# Patient Record
Sex: Male | Born: 1975 | Race: White | Hispanic: No | Marital: Single | Smoking: Former smoker
Health system: Southern US, Community
[De-identification: ages and names within clinical notes are randomized; demographics above are authoritative.]

## PROBLEM LIST (undated history)

## (undated) DIAGNOSIS — F909 Attention-deficit hyperactivity disorder, unspecified type: Secondary | ICD-10-CM

## (undated) DIAGNOSIS — B019 Varicella without complication: Secondary | ICD-10-CM

## (undated) HISTORY — DX: Attention-deficit hyperactivity disorder, unspecified type: F90.9

## (undated) HISTORY — DX: Varicella without complication: B01.9

---

## 1999-07-15 ENCOUNTER — Emergency Department (HOSPITAL_COMMUNITY): Admission: EM | Admit: 1999-07-15 | Discharge: 1999-07-15 | Payer: Self-pay | Admitting: Family Medicine

## 1999-07-15 ENCOUNTER — Encounter: Payer: Self-pay | Admitting: Emergency Medicine

## 1999-07-17 ENCOUNTER — Emergency Department (HOSPITAL_COMMUNITY): Admission: EM | Admit: 1999-07-17 | Discharge: 1999-07-17 | Payer: Self-pay | Admitting: Emergency Medicine

## 2000-06-27 ENCOUNTER — Emergency Department (HOSPITAL_COMMUNITY): Admission: EM | Admit: 2000-06-27 | Discharge: 2000-06-27 | Payer: Self-pay | Admitting: Emergency Medicine

## 2001-12-11 ENCOUNTER — Emergency Department (HOSPITAL_COMMUNITY): Admission: EM | Admit: 2001-12-11 | Discharge: 2001-12-11 | Payer: Self-pay | Admitting: Emergency Medicine

## 2001-12-13 ENCOUNTER — Emergency Department (HOSPITAL_COMMUNITY): Admission: EM | Admit: 2001-12-13 | Discharge: 2001-12-13 | Payer: Self-pay | Admitting: Emergency Medicine

## 2003-06-28 HISTORY — PX: ANKLE FRACTURE SURGERY: SHX122

## 2004-02-06 ENCOUNTER — Emergency Department (HOSPITAL_COMMUNITY): Admission: EM | Admit: 2004-02-06 | Discharge: 2004-02-06 | Payer: Self-pay | Admitting: Emergency Medicine

## 2004-02-12 ENCOUNTER — Ambulatory Visit (HOSPITAL_COMMUNITY): Admission: RE | Admit: 2004-02-12 | Discharge: 2004-02-12 | Payer: Self-pay | Admitting: Orthopedic Surgery

## 2005-08-01 IMAGING — CR DG ANKLE COMPLETE 3+V*L*
4 series · 4 of 4 positions shown · non-contrast
Comparison: none

CLINICAL DATA: Left ankle pain.  Fell jumping over wall.
 LEFT ANKLE
 A trimalleolar fracture is present.  There is a disruption of the ankle mortis.  The lateral malleolar component consists mainly of a distal fibular fracture, which is comminuted along with an avulsion off the inferior aspect of the lateral malleolus.  The medial malleolar fracture is a transverse fracture just above the ankle mortis with slight widening medially.  Posterior malleolar fracture is seen on the lateral view.  Marked soft tissue swelling is present.
 IMPRESSION 
 Trimalleolar fracture with widening of the ankle mortis.

[view not recorded (1 of 4)]
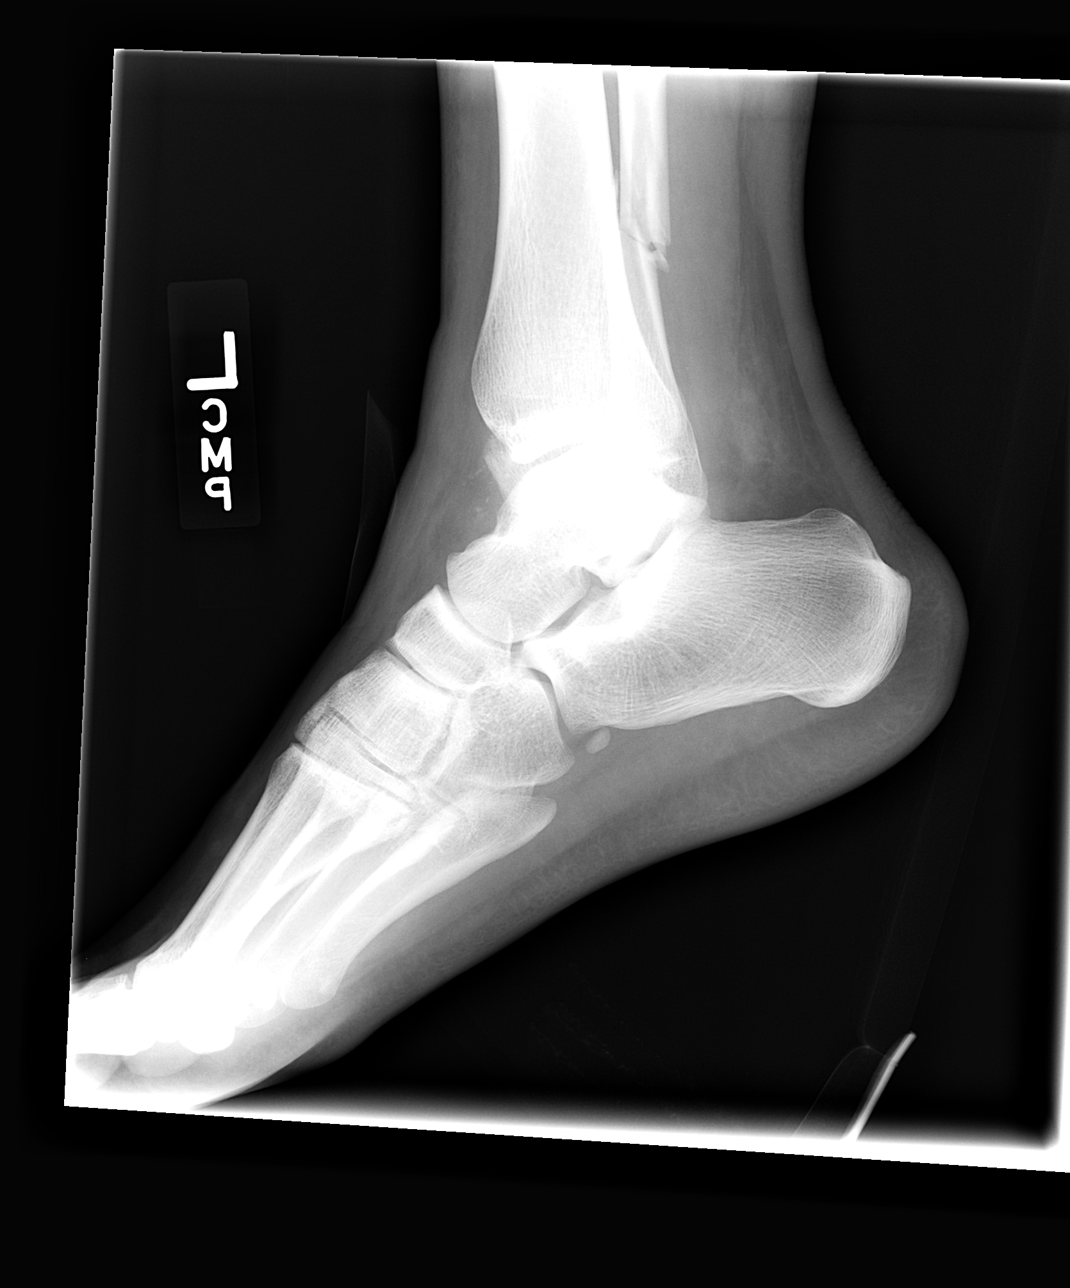

[view not recorded (2 of 4)]
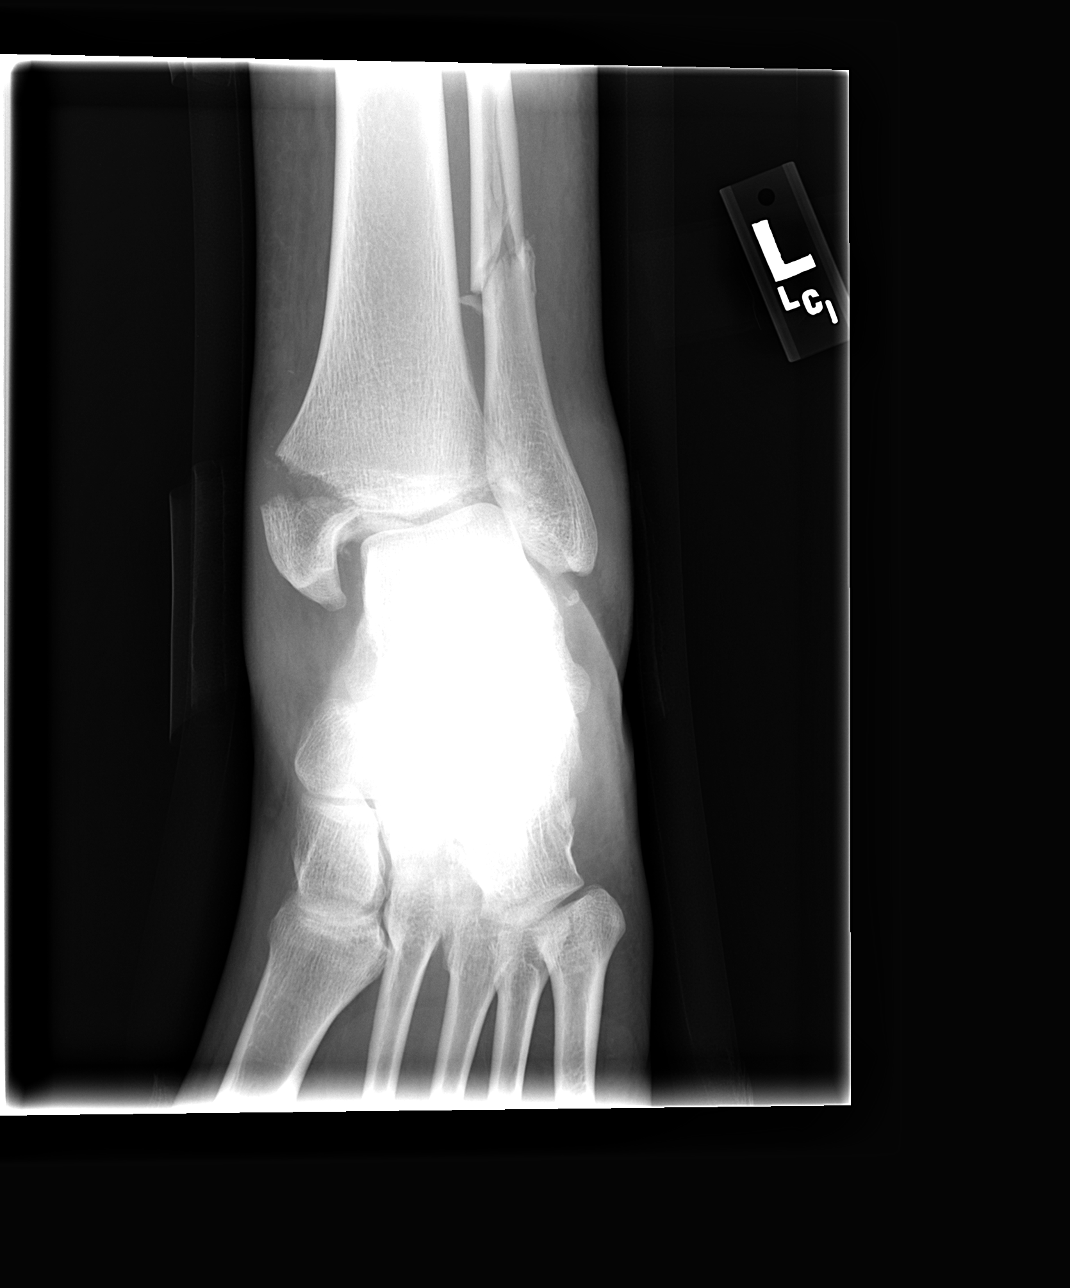

[view not recorded (3 of 4)]
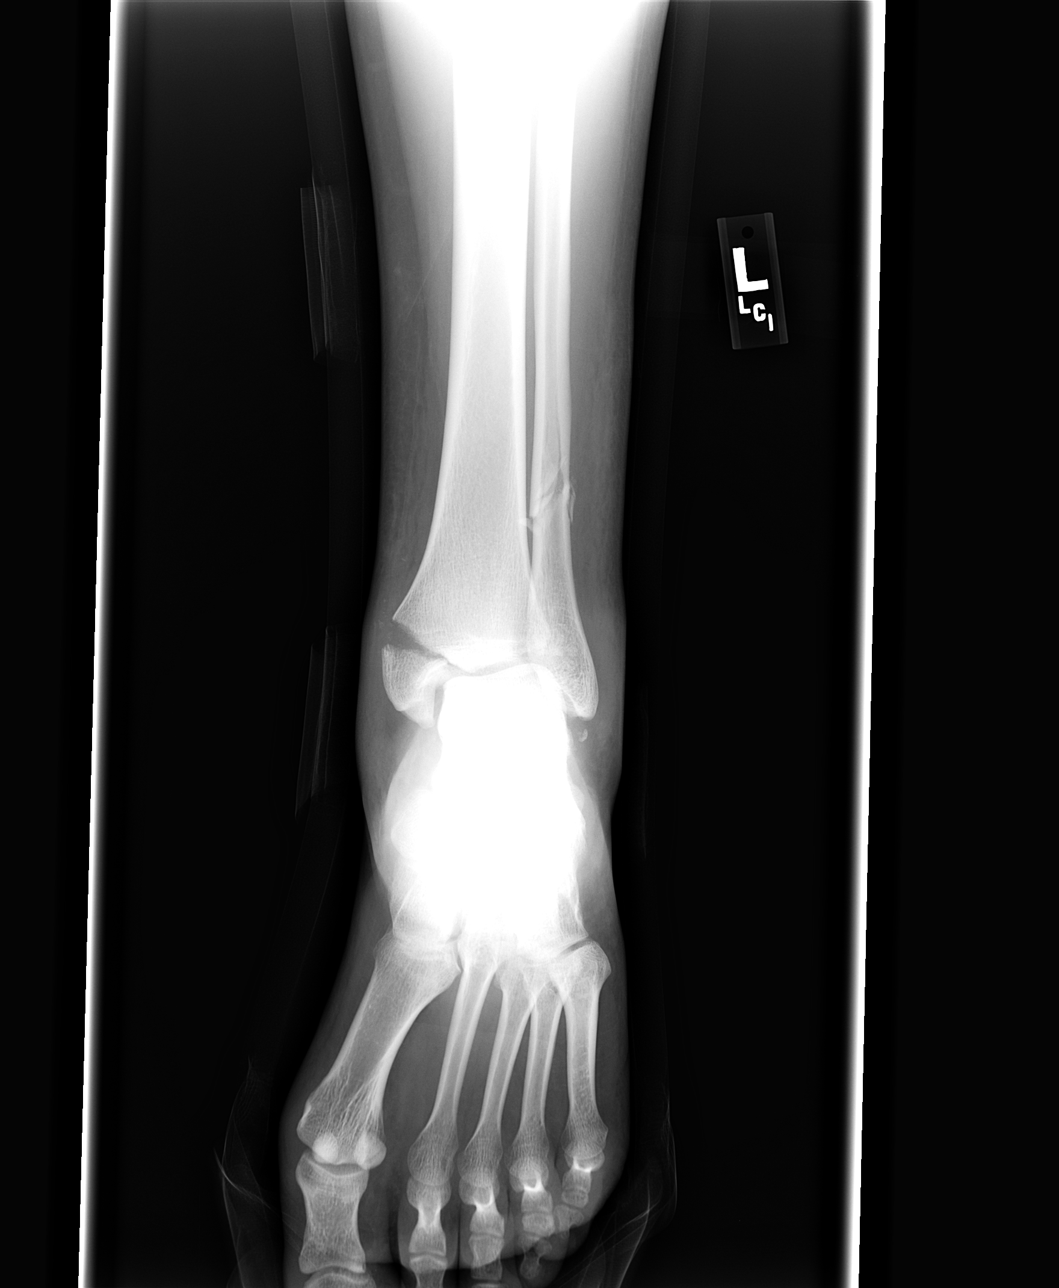

[view not recorded (4 of 4)]
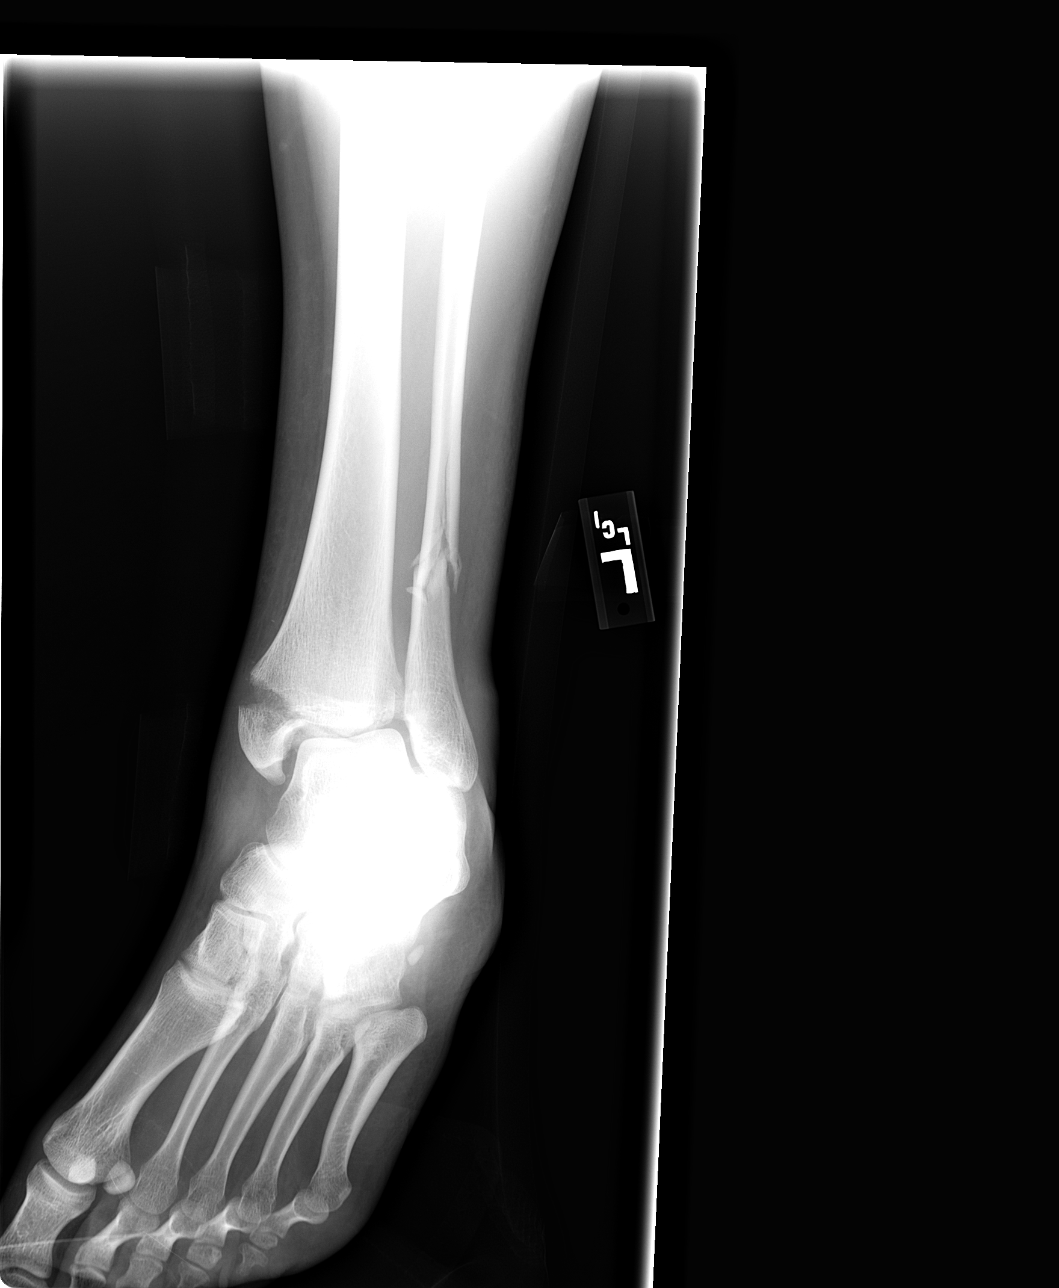

[4 of 4 positions shown; findings below may reference images not displayed]

## 2007-12-10 ENCOUNTER — Emergency Department (HOSPITAL_COMMUNITY): Admission: EM | Admit: 2007-12-10 | Discharge: 2007-12-10 | Payer: Self-pay | Admitting: Emergency Medicine

## 2008-05-01 ENCOUNTER — Emergency Department (HOSPITAL_COMMUNITY): Admission: EM | Admit: 2008-05-01 | Discharge: 2008-05-01 | Payer: Self-pay | Admitting: Family Medicine

## 2008-12-21 ENCOUNTER — Emergency Department (HOSPITAL_COMMUNITY): Admission: EM | Admit: 2008-12-21 | Discharge: 2008-12-21 | Payer: Self-pay | Admitting: Emergency Medicine

## 2010-11-12 NOTE — Op Note (Signed)
NAME:  Shawn Horton, Shawn Horton                           ACCOUNT NO.:  [id number]   MEDICAL RECORD NO.:  [id number]                   PATIENT TYPE:  AMB   LOCATION:  DAY                                  FACILITY:  [facility]   PHYSICIAN:  Madlyn Frankel. Charlann Boxer, M.D.               DATE OF BIRTH:  [birth year]   DATE OF PROCEDURE:  02/12/2004  DATE OF DISCHARGE:                                 OPERATIVE REPORT   PREOPERATIVE DIAGNOSIS:  Left ankle fracture with suspected pronation and  external rotation injury.   POSTOPERATIVE DIAGNOSES/FINDINGS:  Left ankle fracture with pronation and  external rotation type of pattern but with fixed medial and lateral  fractures. There was a stable mortis with no evidence of syndesmotic  disruption on both a stress external rotation as well as a cotton test.   PROCEDURE:  Open reduction/internal fixation, left ankle utilizing a Synthes  small fragment system.   SURGEON:  Durene Romans, M.D.   ASSISTANT:  Surgical tech.   ANESTHESIA:  General.   BLOOD LOSS:  Minimal.   TOURNIQUET TIME:  Ninety minutes at 300 mmHg.   COMPLICATIONS:  None apparent.   DRAINS:  None.   INDICATIONS FOR PROCEDURE:  Shawn Horton is a 35 year old gentleman who was  initially seen in the emergency room on the early morning of February 07, 2004  after he had jumped from a height of approximately 15 feet.  He had  sustained immediate onset of pain and injury with the inability to bear  weight.  He was seen in the emergency room and noted to have transverse  medial malleolar fracture and a fracture of the fibula about 3-4 cm proximal  to his mortis.  It was also noted at that time to have a significant amount  of swelling already.  The decision was made at that point to place him into  a splint and have strict elevation carried out over the next 4-5 days prior  to a surgical set up on Thursday.   The patient was seen and evaluated.  The swelling seemed to have gone down  some; however, he did  have some blistering, particularly on the medial side  of his ankle; however, the blistering did not appear to be blood-filled and  was most importantly posterior to the location of a medial-based incision.  From this, we decided to proceed with the case.  Risks and benefits of the  ankle fracture were reviewed extensively, including the possibility of  syndesmotic disruption and the possible treatment measures of this,  including a single screw through three cortices versus four cortices versus  the possibility of removing the screw at a 12 week period of time.  After  reviewing these risks and benefits to both he and his mother and other  family, consent will be obtained.  The idea being to fix the fractures and  attest for syndesmotic stability before deciding  to place the screw.  At the  time of the surgery, the decision was to be that if there was a syndesmotic  concern, that a single 3-5 cortical screw would be placed __________  cortices through the plate with the intention to leave it in place.   PROCEDURE IN DETAIL:  Patient was brought to the operating theater.  Once  adequate anesthesia and preoperative antibiotics, 1 gm of Ancef were  administered.  The patient was positioned supine with the proximal thigh  tourniquet placed.  The left lower quadrant was then prepped from the toes  to the tourniquet.  The left lower extremity was then prepped and draped  sterilely.  The [place] covering was utilized to seal off the areas of the  toes and proximally as well as the area where the patient had these skin  blisters on the posterior medial aspect of the ankle.  Skin sites were  identified and marked.  Both medial and lateral incisions were opened.  Debris and soft tissue were removed from the medial malleolar fracture site,  which did not include the deltoid ligament but had periosteum, hematoma, and  clot in there.  A temporary fixation with a bone clamp through a single  drill hole  in the proximal aspect of the malleolus was held in reduction.  At this point, attention was directed to the fibula.  The fracture pattern  on the fibula was noted to be a transverse pattern with some comminution  present at the fracture site.  The fracture site was visually reduced  anatomically and held into position with the use of a Verbrugge clamp and an  8-hole plate.  Radiographic evaluation of the ankle confirmed reduction.  At  this point, two 4-0 partially threaded cancellous screws were placed  parallel anterior and posterior to the malleolar ridge.  Screw position was  confirmed radiographically, and reduction was confirmed radiographically.  Reduction forceps were kept in place while attention was directed to the  fibular side to ensure no displacement while we were finalizing reduction  laterally.  Once the reduction laterally was confirmed and screws placed,  three bicortical screws were placed proximal, two distal with a single  cancellous one in the distal malleolus aspect of the fibula.  With the plate  fixed and radiographic confirmation of the anatomically reduced fracture,  the attention was directed to examining the syndesmosis.  Direct inspection  indicated that the distal portion of the fibula was intact in continuity  with the distal tibia based on its behavior.  Once the plate was secured to  the proximal aspect of the fibula, mortis views were obtained  radiographically, followed by a stress test with external rotation.  There  was no displacement of the talus, no opening of the clear space, and was  concerning for syndesmosis disruption.  A second test was carried out in  line with the cotton test.  With a bone clamp placed across the fibula, the  fixed fibula, there was no lateral translation of the fibula and talus  underneath the tibial mortis.  With this, I was convinced that there was no evidence of a complete syndesmotic disruption, and for this reason,  decided  that this would be the treatment of choice.  The wounds were copiously  irrigated with normal saline at this point.  The medial and lateral  incisions were closed in layers with 2-0 Vicryl and 3-0 nylon on the skin.  The wounds were then cleaned.  The entire  foot and ankle were cleaned,  dried, and dressed sterilely.  The tourniquet was let down following  placement of a sterile bulky Jones dressing and the application of a  posterior L&U splint in plaster.  The foot was kept in dorsiflexion.   The patient was awakened from anesthesia and transferred to the recovery  room in stable condition.   Postoperative plan, we will call for the patient to be nonweightbearing for  4-6 weeks with strict elevation.  At that point, we will begin weightbearing  activities.  Patient will be given medications for pain, muscle relaxation,  and given the fact that he had these blisters, just as a precaution, he will  be placed on antibiotics for seven days.                                               Madlyn Frankel Charlann Boxer, M.D.    MDO/MEDQ  D:  02/13/2004  T:  02/13/2004  Job:  [id number]

## 2014-07-04 ENCOUNTER — Ambulatory Visit (INDEPENDENT_AMBULATORY_CARE_PROVIDER_SITE_OTHER): Payer: 59 | Admitting: Podiatrist

## 2014-07-04 ENCOUNTER — Encounter: Payer: Self-pay | Admitting: Podiatrist

## 2014-07-04 ENCOUNTER — Other Ambulatory Visit: Payer: Self-pay | Admitting: Podiatrist

## 2014-07-04 VITALS — BP 118/78 | HR 67 | Resp 11 | Ht 69.0 in | Wt 175.0 lb

## 2014-07-04 DIAGNOSIS — Z79899 Other long term (current) drug therapy: Secondary | ICD-10-CM

## 2014-07-04 DIAGNOSIS — B351 Tinea unguium: Secondary | ICD-10-CM

## 2014-07-04 LAB — CBC WITH DIFFERENTIAL/PLATELET
Basophils Absolute: 0.1 10*3/uL (ref 0.0–0.1)
Basophils Relative: 1 % (ref 0–1)
Eosinophils Absolute: 0.1 10*3/uL (ref 0.0–0.7)
Eosinophils Relative: 1 % (ref 0–5)
HCT: 41 % (ref 39.0–52.0)
Hemoglobin: 14.1 g/dL (ref 13.0–17.0)
Lymphocytes Relative: 23 % (ref 12–46)
Lymphs Abs: 1.4 10*3/uL (ref 0.7–4.0)
MCH: 31.3 pg (ref 26.0–34.0)
MCHC: 34.4 g/dL (ref 30.0–36.0)
MCV: 91.1 fL (ref 78.0–100.0)
MPV: 9.2 fL (ref 8.6–12.4)
Monocytes Absolute: 0.5 10*3/uL (ref 0.1–1.0)
Monocytes Relative: 8 % (ref 3–12)
Neutro Abs: 4 10*3/uL (ref 1.7–7.7)
Neutrophils Relative %: 67 % (ref 43–77)
Platelets: 171 10*3/uL (ref 150–400)
RBC: 4.5 MIL/uL (ref 4.22–5.81)
RDW: 15.3 % (ref 11.5–15.5)
WBC: 6 10*3/uL (ref 4.0–10.5)

## 2014-07-04 LAB — HEPATIC FUNCTION PANEL
ALT: 16 U/L (ref 0–53)
AST: 17 U/L (ref 0–37)
Albumin: 4.3 g/dL (ref 3.5–5.2)
Alkaline Phosphatase: 59 U/L (ref 39–117)
Bilirubin, Direct: 0.2 mg/dL (ref 0.0–0.3)
Indirect Bilirubin: 0.7 mg/dL (ref 0.2–1.2)
Total Bilirubin: 0.9 mg/dL (ref 0.2–1.2)
Total Protein: 6.4 g/dL (ref 6.0–8.3)

## 2014-07-04 NOTE — Progress Notes (Deleted)
   Subjective:    Patient ID: Shawn Horton, male    DOB: 10/10/1975, 39 y.o.   MRN: [id number]  HPI    Review of Systems  All other systems reviewed and are negative.      Objective:   Physical Exam        Assessment & Plan:

## 2014-07-04 NOTE — Patient Instructions (Signed)
Onychomycosis  A fungal infection of the nail (tinea unguium/onychomycosis) is common. It is common as the visible part of the nail is composed of dead cells which have no blood supply to help prevent infection. It occurs because fungi are everywhere and will pick any opportunity to grow on any dead material. Because nails are very slow growing they require up to 2 years of treatment with anti-fungal medications. The entire nail back to the base is infected. This includes approximately  of the nail which you cannot see. If your caregiver has prescribed a medication by mouth, take it every day and as directed. No progress will be seen for at least 6 to 9 months. Do not be disappointed! Because fungi live on dead cells with little or no exposure to blood supply, medication delivery to the infection is slow; thus the cure is slow. It is also why you can observe no progress in the first 6 months. The nail becoming cured is the base of the nail, as it has the blood supply. Topical medication such as creams and ointments are usually not effective. Important in successful treatment of nail fungus is closely following the medication regimen that your doctor prescribes. Sometimes you and your caregiver may elect to speed up this process by surgical removal of all the nails. Even this may still require 6 to 9 months of additional oral medications. See your caregiver as directed. Remember there will be no visible improvement for at least 6 months. See your caregiver sooner if other signs of infection (redness and swelling) develop. Document Released: 06/10/2000 Document Revised: 09/05/2011 Document Reviewed: 08/19/2008 Exit[employer] Patient Information 2015 [employer]. This information is not intended to replace advice given to you by your health care provider. Make sure you discuss any questions you have with your health care provider.  

## 2014-07-07 NOTE — Progress Notes (Signed)
  Chief Complaint  Patient presents with  . Nail Problem    "I have some pretty wicked toenails." B/L 1, 3, 4, 5th toenails are thick, and discolored.     HPI: Patient is 39 y.o. male who presents today for fungal toenails bilateral feet. He relates that they're thick, discolored, and ugly. He's tried no treatment in the past. He has no liver or kidney issues or medical problems.   Review of Systems  DATA OBTAINED: from patient  GENERAL: Feels well no fevers, no fatigue, no changes in appetite SKIN: No itching, no rashes, no open wounds EYES: No eye pain,no redness, no discharge EARS: No earache,no ringing of ears, NOSE: No congestion, no drainage, no bleeding  MOUTH/THROAT: No mouth pain, No sore throat, No difficulty chewing or swallowing  RESPIRATORY: No cough, no wheezing, no SOB CARDIAC: No chest pain,no heart palpitations, GI: No abdominal pain, No Nausea, no vomiting, no diarrhea, no heartburn or no reflux  GU: No dysuria, no increased frequency or urgency MUSCULOSKELETAL: No unrelieved bone/joint pain,  NEUROLOGIC: Awake, alert, appropriate to situation, No change in mental status. PSYCHIATRIC: No overt anxiety or sadness.No behavior issue.      Physical Exam  GENERAL APPEARANCE: Alert, conversant. Appropriately groomed. No acute distress.  VASCULAR: Pedal pulses palpable at 2/4 DP and PT bilateral.  Capillary refill time is immediate to all digits,  Proximal to distal cooling it warm to warm.  Digital hair growth is present bilateral  NEUROLOGIC: sensation is intact epicritically and protectively to 5.07 monofilament at 5/5 sites bilateral.  Light touch is intact bilateral, vibratory sensation intact bilateral, achilles tendon reflex is intact bilateral.  MUSCULOSKELETAL: acceptable muscle strength, tone and stability bilateral.  Intrinsic muscluature intact bilateral.  Rectus appearance of foot and digits noted bilateral.   DERMATOLOGIC: Patient has thickened,  discolored, dystrophic, yellowish brownish toenails with subungual debris present bilateral feet. They do appear clinically mycotic and dystrophic. Otherwise skin color, texture, and turger are within normal limits.  No preulcerative lesions are seen, no interdigital maceration noted.  No open lesions present.  Digital nails are asymptomatic.    There are no active problems to display for this patient.   Assessment   Mycotic toenails bilateral feet  Plan Discussed treatment options and alternatives with both laser, topical and oral medications. Due to the involvement of the toenails I do believe that oral medication is the only way to clear the toenails of the fungus. A nail sample was taken at today's visit we will send this off for analysis. Once the result of the sample comes back we'll decide on oral treatment. Forms for labs were also dispensed at today's visit so that once the treatment recommendation is decided he can go ahead and start therapy. We'll call with the result of the culture and with the recommended oral therapy.

## 2014-07-15 ENCOUNTER — Telehealth: Payer: Self-pay | Admitting: *Deleted

## 2014-07-15 NOTE — Telephone Encounter (Signed)
-----   Message from Delories HeinzKathryn P Egerton, DPM sent at 07/11/2014  5:49 PM EST ----- Regarding: labs Fyi== I think for a lot of these labs you have already given them to me.. Not sure if you can cross reference but I'll forward them on just in case  Labs look great-- for this patient  Thanks!  E ----- Message -----    From: SYSTEM    Sent: 07/09/2014  12:04 AM      To: Delories HeinzKathryn P Egerton, DPM

## 2014-07-15 NOTE — Telephone Encounter (Signed)
I called and informed the patient that his labs were okay per Dr. Irving ShowsEgerton.  "Will she be calling in a prescription for me?"  We are still waiting on the final results for the culture.  We will let you know when we get the results.  "Okay, thank you."

## 2014-07-25 ENCOUNTER — Ambulatory Visit (INDEPENDENT_AMBULATORY_CARE_PROVIDER_SITE_OTHER): Payer: 59 | Admitting: Family

## 2014-07-25 ENCOUNTER — Encounter: Payer: Self-pay | Admitting: Family

## 2014-07-25 VITALS — BP 122/80 | HR 56 | Temp 97.8°F | Resp 18 | Ht 69.0 in | Wt 182.2 lb

## 2014-07-25 DIAGNOSIS — Z9289 Personal history of other medical treatment: Secondary | ICD-10-CM

## 2014-07-25 DIAGNOSIS — Z9189 Other specified personal risk factors, not elsewhere classified: Secondary | ICD-10-CM

## 2014-07-25 DIAGNOSIS — Z23 Encounter for immunization: Secondary | ICD-10-CM

## 2014-07-25 DIAGNOSIS — F909 Attention-deficit hyperactivity disorder, unspecified type: Secondary | ICD-10-CM | POA: Insufficient documentation

## 2014-07-25 DIAGNOSIS — F17200 Nicotine dependence, unspecified, uncomplicated: Secondary | ICD-10-CM | POA: Insufficient documentation

## 2014-07-25 DIAGNOSIS — Z720 Tobacco use: Secondary | ICD-10-CM

## 2014-07-25 DIAGNOSIS — R41840 Attention and concentration deficit: Secondary | ICD-10-CM

## 2014-07-25 DIAGNOSIS — Z2839 Other underimmunization status: Secondary | ICD-10-CM

## 2014-07-25 HISTORY — DX: Attention-deficit hyperactivity disorder, unspecified type: F90.9

## 2014-07-25 NOTE — Assessment & Plan Note (Signed)
Patient indicates decreased attention span. He was never tested for ADHD or ADD as an adult. Refer to psychology for further testing.

## 2014-07-25 NOTE — Addendum Note (Signed)
Addended by: Mercer PodWRENN, Acire Tang E on: 07/25/2014 10:57 AM   Modules accepted: Orders

## 2014-07-25 NOTE — Progress Notes (Signed)
Pre visit review using our clinic review tool, if applicable. No additional management support is needed unless otherwise documented below in the visit note. 

## 2014-07-25 NOTE — Assessment & Plan Note (Signed)
Patient is currently contemplating quitting smoking. He is using E cigarettes. Discussed possibilities of Chantix or Wellbutrin to assist in the process. Patient will research and follow-up with his choice if he wishes to use medication. Smoking cessation material provided to patient. Follow-up as needed for support.

## 2014-07-25 NOTE — Assessment & Plan Note (Signed)
Tetanus shot provided in office. Patient declined flu shot. Follow-up annual physical.

## 2014-07-25 NOTE — Progress Notes (Signed)
Subjective:    Patient ID: Shawn Horton, male    DOB: 09/18/1975, 39 y.o.   MRN: [id number]  Chief Complaint  Patient presents with  . Establish Care    no issues     HPI:  Shawn GreavesRobert Culley is a 39 y.o. male who presents today to establish care and discuss   1) Attention deficit - Indicates that he has experienced the associated symptom of decreased attention has been going on for a couple of years. Notes that it has worsened as he as aged. Denies any modifying factors. Indicates that he has never been officially tested for ADD/ADHD.  2) Health Maintenance - indicates that he is is need of immunizations.  Health Maintenance  Topic Date Due  . TETANUS/TDAP  12/01/1994  . INFLUENZA VACCINE  01/25/2014  Declines flu shot  3) Smoking Cessation - patient has a history of smoking for approximately 20 years. He is currently using E cigarettes. He is indicated that he is wanting to quit smoking at this time. Has previously tried patches and gum with no success.  No Known Allergies   No current outpatient prescriptions on file prior to visit.   No current facility-administered medications on file prior to visit.    Past Medical History  Diagnosis Date  . Chicken pox     Past Surgical History  Procedure Laterality Date  . Ankle fracture surgery Left 2005    Family History  Problem Relation Age of Onset  . Uterine cancer Mother   . Breast cancer Mother   . Lung cancer Mother   . Alcohol abuse Father   . Hyperlipidemia Father   . Heart disease Father   . Diabetes Father   . Heart disease Maternal Grandfather     History   Social History  . Marital Status: Single    Spouse Name: N/A    Number of Children: 2  . Years of Education: 12   Occupational History  . Construction    Social History Main Topics  . Smoking status: Former Smoker -- 0.50 packs/day for 20 years    Types: Cigarettes, E-cigarettes    Quit date: 07/24/2014  . Smokeless tobacco: Never Used  .  Alcohol Use: 0.0 oz/week    0 Not specified per week     Comment: not currently  . Drug Use: No  . Sexual Activity: Not on file   Other Topics Concern  . Not on file   Social History Narrative   [place], [place] Currently resides in a house girlfriend and the children. 2 dogs. Fun: Disc golf   Denies religious beliefs that would effect health care.     Review of Systems  Respiratory: Negative for cough, chest tightness and wheezing.   Cardiovascular: Negative for chest pain and palpitations.  Psychiatric/Behavioral: Positive for decreased concentration.      Objective:    BP 122/80 mmHg  Pulse 56  Temp(Src) 97.8 F (36.6 C) (Oral)  Resp 18  Ht 5\' 9"  (1.753 m)  Wt 182 lb 3.2 oz (82.645 kg)  BMI 26.89 kg/m2  SpO2 98% Nursing note and vital signs reviewed.  Physical Exam  Constitutional: He is oriented to person, place, and time. He appears well-developed and well-nourished. No distress.  Cardiovascular: Normal rate, regular rhythm, normal heart sounds and intact distal pulses.   Pulmonary/Chest: Effort normal and breath sounds normal.  Neurological: He is alert and oriented to person, place, and time.  Skin: Skin is warm  and dry.  Psychiatric: He has a normal mood and affect. His behavior is normal. Judgment and thought content normal.       Assessment & Plan:

## 2014-07-25 NOTE — Patient Instructions (Addendum)
Thank you for choosing [facility].  Summary/Instructions:  Please stop by radiology on the basement level of the building for your x-rays. Your results will be released to MyChart (or called to you) after review, usually within 72 hours after test completion. If any treatments or changes are necessary, you will be notified at that same time.  Referrals have been made during this visit. You should expect to hear back from our schedulers in about 7-10 days in regards to establishing an appointment with the specialists we discussed.   If your symptoms worsen or fail to improve, please contact our office for further instruction, or in case of emergency go directly to the emergency room at the closest medical facility.    Varenicline oral tablets What is this medicine? VARENICLINE (var EN i kleen) is used to help people quit smoking. It can reduce the symptoms caused by stopping smoking. It is used with a patient support program recommended by your physician. This medicine may be used for other purposes; ask your health care provider or pharmacist if you have questions. COMMON BRAND NAME(S): Chantix What should I tell my health care provider before I take this medicine? They need to know if you have any of these conditions: -bipolar disorder, depression, schizophrenia or other mental illness -heart disease -if you often drink alcohol -kidney disease -peripheral vascular disease -seizures -stroke -suicidal thoughts, plans, or attempt; a previous suicide attempt by you or a family member -an unusual or allergic reaction to varenicline, other medicines, foods, dyes, or preservatives -pregnant or trying to get pregnant -breast-feeding How should I use this medicine? You should set a date to stop smoking and tell your doctor. Start this medicine one week before the quit date. You can also start taking this medicine before you choose a quit date, and then pick a quit date that is between 8  and 35 days of treatment with this medicine. Stick to your plan; ask about support groups or other ways to help you remain a 'quitter'. Take this medicine by mouth after eating. Take with a full glass of water. Follow the directions on the prescription label. Take your doses at regular intervals. Do not take your medicine more often than directed. A special MedGuide will be given to you by the pharmacist with each prescription and refill. Be sure to read this information carefully each time. Talk to your pediatrician regarding the use of this medicine in children. This medicine is not approved for use in children. Overdosage: If you think you have taken too much of this medicine contact a poison control center or emergency room at once. NOTE: This medicine is only for you. Do not share this medicine with others. What if I miss a dose? If you miss a dose, take it as soon as you can. If it is almost time for your next dose, take only that dose. Do not take double or extra doses. What may interact with this medicine? -alcohol or any product that contains alcohol -insulin -other stop smoking aids -theophylline -warfarin This list may not describe all possible interactions. Give your health care provider a list of all the medicines, herbs, non-prescription drugs, or dietary supplements you use. Also tell them if you smoke, drink alcohol, or use illegal drugs. Some items may interact with your medicine. What should I watch for while using this medicine? Visit your doctor or health care professional for regular check ups. Ask for ongoing advice and encouragement from your doctor or healthcare professional,  friends, and family to help you quit. If you smoke while on this medication, quit again Your mouth may get dry. Chewing sugarless gum or sucking hard candy, and drinking plenty of water may help. Contact your doctor if the problem does not go away or is severe. You may get drowsy or dizzy. Do not drive,  use machinery, or do anything that needs mental alertness until you know how this medicine affects you. Do not stand or sit up quickly, especially if you are an older patient. This reduces the risk of dizzy or fainting spells. The use of this medicine may increase the chance of suicidal thoughts or actions. Pay special attention to how you are responding while on this medicine. Any worsening of mood, or thoughts of suicide or dying should be reported to your health care professional right away. What side effects may I notice from receiving this medicine? Side effects that you should report to your doctor or health care professional as soon as possible: -allergic reactions like skin rash, itching or hives, swelling of the face, lips, tongue, or throat -breathing problems -changes in vision -chest pain or chest tightness -confusion, trouble speaking or understanding -fast, irregular heartbeat -feeling faint or lightheaded, falls -fever -pain in legs when walking -problems with balance, talking, walking -redness, blistering, peeling or loosening of the skin, including inside the mouth -ringing in ears -seizures -sudden numbness or weakness of the face, arm or leg -suicidal thoughts or other mood changes -trouble passing urine or change in the amount of urine -unusual bleeding or bruising -unusually weak or tired Side effects that usually do not require medical attention (report to your doctor or health care professional if they continue or are bothersome): -constipation -headache -nausea, vomiting -strange dreams -stomach gas -trouble sleeping This list may not describe all possible side effects. Call your doctor for medical advice about side effects. You may report side effects to FDA at 1-800-FDA-1088. Where should I keep my medicine? Keep out of the reach of children. Store at room temperature between 15 and 30 degrees C (59 and 86 degrees F). Throw away any unused medicine after the  expiration date. NOTE: This sheet is a summary. It may not cover all possible information. If you have questions about this medicine, talk to your doctor, pharmacist, or health care provider.  2015, Elsevier/Gold Standard. (2013-03-25 13:37:47)  Bupropion sustained-release tablets (smoking cessation) What is this medicine? BUPROPION (byoo PROE pee on) is used to help people quit smoking. This medicine may be used for other purposes; ask your health care provider or pharmacist if you have questions. COMMON BRAND NAME(S): Buproban, Zyban What should I tell my health care provider before I take this medicine? They need to know if you have any of these conditions: -an eating disorder, such as anorexia or bulimia -bipolar disorder or psychosis -diabetes or high blood sugar, treated with medication -glaucoma -head injury or brain tumor -heart disease, previous heart attack, or irregular heart beat -high blood pressure -kidney or liver disease -seizures -suicidal thoughts or a previous suicide attempt -Tourette's syndrome -weight loss -an unusual or allergic reaction to bupropion, other medicines, foods, dyes, or preservatives -breast-feeding -pregnant or trying to become pregnant How should I use this medicine? Take this medicine by mouth with a glass of water. Follow the directions on the prescription label. You can take it with or without food. If it upsets your stomach, take it with food. Do not cut, crush or chew this medicine. Take your medicine at  regular intervals. If you take this medicine more than once a day, take your second dose at least 8 hours after you take your first dose. To limit difficulty in sleeping, avoid taking this medicine at bedtime. Do not take your medicine more often than directed. Do not stop taking this medicine suddenly except upon the advice of your doctor. Stopping this medicine too quickly may cause serious side effects. A special MedGuide will be given to  you by the pharmacist with each prescription and refill. Be sure to read this information carefully each time. Talk to your pediatrician regarding the use of this medicine in children. Special care may be needed. Overdosage: If you think you have taken too much of this medicine contact a poison control center or emergency room at once. NOTE: This medicine is only for you. Do not share this medicine with others. What if I miss a dose? If you miss a dose, skip the missed dose and take your next tablet at the regular time. There should be at least 8 hours between doses. Do not take double or extra doses. What may interact with this medicine? Do not take this medicine with any of the following medications: -linezolid -MAOIs like Azilect, Carbex, Eldepryl, Marplan, Nardil, and Parnate -methylene blue (injected into a vein) -other medicines that contain bupropion like Wellbutrin This medicine may also interact with the following medications: -alcohol -certain medicines for anxiety or sleep -certain medicines for blood pressure like metoprolol, propranolol -certain medicines for depression or psychotic disturbances -certain medicines for HIV or AIDS like efavirenz, lopinavir, nelfinavir, ritonavir -certain medicines for irregular heart beat like propafenone, flecainide -certain medicines for Parkinson's disease like amantadine, levodopa -certain medicines for seizures like carbamazepine, phenytoin, phenobarbital -cimetidine -clopidogrel -cyclophosphamide -furazolidone -isoniazid -nicotine -orphenadrine -procarbazine -steroid medicines like prednisone or cortisone -stimulant medicines for attention disorders, weight loss, or to stay awake -tamoxifen -theophylline -thiotepa -ticlopidine -tramadol -warfarin This list may not describe all possible interactions. Give your health care provider a list of all the medicines, herbs, non-prescription drugs, or dietary supplements you use. Also  tell them if you smoke, drink alcohol, or use illegal drugs. Some items may interact with your medicine. What should I watch for while using this medicine? Visit your doctor or health care professional for regular checks on your progress. This medicine should be used together with a patient support program. It is important to participate in a behavioral program, counseling, or other support program that is recommended by your health care professional. Patients and their families should watch out for new or worsening thoughts of suicide or depression. Also watch out for sudden changes in feelings such as feeling anxious, agitated, panicky, irritable, hostile, aggressive, impulsive, severely restless, overly excited and hyperactive, or not being able to sleep. If this happens, especially at the beginning of treatment or after a change in dose, call your health care professional. Avoid alcoholic drinks while taking this medicine. Drinking excessive alcoholic beverages, using sleeping or anxiety medicines, or quickly stopping the use of these agents while taking this medicine may increase your risk for a seizure. Do not drive or use heavy machinery until you know how this medicine affects you. This medicine can impair your ability to perform these tasks. Do not take this medicine close to bedtime. It may prevent you from sleeping. Your mouth may get dry. Chewing sugarless gum or sucking hard candy, and drinking plenty of water may help. Contact your doctor if the problem does not go away or is  severe. Do not use nicotine patches or chewing gum without the advice of your doctor or health care professional while taking this medicine. You may need to have your blood pressure taken regularly if your doctor recommends that you use both nicotine and this medicine together. What side effects may I notice from receiving this medicine? Side effects that you should report to your doctor or health care professional as  soon as possible: -allergic reactions like skin rash, itching or hives, swelling of the face, lips, or tongue -breathing problems -changes in vision -confusion -fast or irregular heartbeat -hallucinations -increased blood pressure -redness, blistering, peeling or loosening of the skin, including inside the mouth -seizures -suicidal thoughts or other mood changes -unusually weak or tired -vomiting Side effects that usually do not require medical attention (report to your doctor or health care professional if they continue or are bothersome): -change in sex drive or performance -constipation -headache -loss of appetite -nausea -tremors -weight loss This list may not describe all possible side effects. Call your doctor for medical advice about side effects. You may report side effects to FDA at 1-800-FDA-1088. Where should I keep my medicine? Keep out of the reach of children. Store at room temperature between 20 and 25 degrees C (68 and 77 degrees F). Protect from light. Keep container tightly closed. Throw away any unused medicine after the expiration date. NOTE: This sheet is a summary. It may not cover all possible information. If you have questions about this medicine, talk to your doctor, pharmacist, or health care provider.  2015, Elsevier/Gold Standard. (2013-02-08 10:55:10)  Smoking Cessation Quitting smoking is important to your health and has many advantages. However, it is not always easy to quit since nicotine is a very addictive drug. Oftentimes, people try 3 times or more before being able to quit. This document explains the best ways for you to prepare to quit smoking. Quitting takes hard work and a lot of effort, but you can do it. ADVANTAGES OF QUITTING SMOKING  You will live longer, feel better, and live better.  Your body will feel the impact of quitting smoking almost immediately.  Within 20 minutes, blood pressure decreases. Your pulse returns to its normal  level.  After 8 hours, carbon monoxide levels in the blood return to normal. Your oxygen level increases.  After 24 hours, the chance of having a heart attack starts to decrease. Your breath, hair, and body stop smelling like smoke.  After 48 hours, damaged nerve endings begin to recover. Your sense of taste and smell improve.  After 72 hours, the body is virtually free of nicotine. Your bronchial tubes relax and breathing becomes easier.  After 2 to 12 weeks, lungs can hold more air. Exercise becomes easier and circulation improves.  The risk of having a heart attack, stroke, cancer, or lung disease is greatly reduced.  After 1 year, the risk of coronary heart disease is cut in half.  After 5 years, the risk of stroke falls to the same as a nonsmoker.  After 10 years, the risk of lung cancer is cut in half and the risk of other cancers decreases significantly.  After 15 years, the risk of coronary heart disease drops, usually to the level of a nonsmoker.  If you are pregnant, quitting smoking will improve your chances of having a healthy baby.  The people you live with, especially any children, will be healthier.  You will have extra money to spend on things other than cigarettes. QUESTIONS TO  THINK ABOUT BEFORE ATTEMPTING TO QUIT You may want to talk about your answers with your health care provider.  Why do you want to quit?  If you tried to quit in the past, what helped and what did not?  What will be the most difficult situations for you after you quit? How will you plan to handle them?  Who can help you through the tough times? Your family? Friends? A health care provider?  What pleasures do you get from smoking? What ways can you still get pleasure if you quit? Here are some questions to ask your health care provider:  How can you help me to be successful at quitting?  What medicine do you think would be best for me and how should I take it?  What should I do if I  need more help?  What is smoking withdrawal like? How can I get information on withdrawal? GET READY  Set a quit date.  Change your environment by getting rid of all cigarettes, ashtrays, matches, and lighters in your home, car, or work. Do not let people smoke in your home.  Review your past attempts to quit. Think about what worked and what did not. GET SUPPORT AND ENCOURAGEMENT You have a better chance of being successful if you have help. You can get support in many ways.  Tell your family, friends, and coworkers that you are going to quit and need their support. Ask them not to smoke around you.  Get individual, group, or telephone counseling and support. Programs are available at [employer] and health centers. Call your local health department for information about programs in your area.  Spiritual beliefs and practices may help some smokers quit.  Download a "quit meter" on your computer to keep track of quit statistics, such as how long you have gone without smoking, cigarettes not smoked, and money saved.  Get a self-help book about quitting smoking and staying off tobacco. LEARN NEW SKILLS AND BEHAVIORS  Distract yourself from urges to smoke. Talk to someone, go for a walk, or occupy your time with a task.  Change your normal routine. Take a different route to work. Drink tea instead of coffee. Eat breakfast in a different place.  Reduce your stress. Take a hot bath, exercise, or read a book.  Plan something enjoyable to do every day. Reward yourself for not smoking.  Explore interactive web-based programs that specialize in helping you quit. GET MEDICINE AND USE IT CORRECTLY Medicines can help you stop smoking and decrease the urge to smoke. Combining medicine with the above behavioral methods and support can greatly increase your chances of successfully quitting smoking.  Nicotine replacement therapy helps deliver nicotine to your body without the negative effects  and risks of smoking. Nicotine replacement therapy includes nicotine gum, lozenges, inhalers, nasal sprays, and skin patches. Some may be available over-the-counter and others require a prescription.  Antidepressant medicine helps people abstain from smoking, but how this works is unknown. This medicine is available by prescription.  Nicotinic receptor partial agonist medicine simulates the effect of nicotine in your brain. This medicine is available by prescription. Ask your health care provider for advice about which medicines to use and how to use them based on your health history. Your health care provider will tell you what side effects to look out for if you choose to be on a medicine or therapy. Carefully read the information on the package. Do not use any other product containing nicotine while  using a nicotine replacement product.  RELAPSE OR DIFFICULT SITUATIONS Most relapses occur within the first 3 months after quitting. Do not be discouraged if you start smoking again. Remember, most people try several times before finally quitting. You may have symptoms of withdrawal because your body is used to nicotine. You may crave cigarettes, be irritable, feel very hungry, cough often, get headaches, or have difficulty concentrating. The withdrawal symptoms are only temporary. They are strongest when you first quit, but they will go away within 10-14 days. To reduce the chances of relapse, try to:  Avoid drinking alcohol. Drinking lowers your chances of successfully quitting.  Reduce the amount of caffeine you consume. Once you quit smoking, the amount of caffeine in your body increases and can give you symptoms, such as a rapid heartbeat, sweating, and anxiety.  Avoid smokers because they can make you want to smoke.  Do not let weight gain distract you. Many smokers will gain weight when they quit, usually less than 10 pounds. Eat a healthy diet and stay active. You can always lose the weight  gained after you quit.  Find ways to improve your mood other than smoking. FOR MORE INFORMATION  www.smokefree.gov  Document Released: 06/07/2001 Document Revised: 10/28/2013 Document Reviewed: 09/22/2011 [facility] Patient Information 2015 [place], [place]. This information is not intended to replace advice given to you by your health care provider. Make sure you discuss any questions you have with your health care provider.

## 2014-07-31 ENCOUNTER — Encounter: Payer: Self-pay | Admitting: Podiatrist

## 2014-08-05 ENCOUNTER — Telehealth: Payer: Self-pay | Admitting: *Deleted

## 2014-08-05 NOTE — Telephone Encounter (Signed)
Pt's wife asked to call pt with fungal results.  I informed pt the results are final in 4 - 6 weeks of testing and we would call with instructions once available.

## 2014-08-11 ENCOUNTER — Telehealth: Payer: Self-pay | Admitting: *Deleted

## 2014-08-11 NOTE — Telephone Encounter (Signed)
-----   Message from Delories HeinzKathryn P Egerton, DPM sent at 07/07/2014 12:12 PM EST ----- Labs Look Great--   (I don't think he is on an oral med yet-- will wait on bako culture before deciding the oral med to take)  Thanks! ----- Message -----    From: Lab in [facility]    Sent: 07/04/2014  10:13 PM      To: Delories HeinzKathryn P Egerton, DPM

## 2014-08-11 NOTE — Telephone Encounter (Signed)
I called and requested pathology results.  "It is available, I will fax it right a way."

## 2014-08-18 ENCOUNTER — Telehealth: Payer: Self-pay | Admitting: Podiatrist

## 2014-08-18 NOTE — Telephone Encounter (Signed)
PTS WIFE CALLED AND ASKING ABOUT MEDICATION FOR PT SEEN 6 WKS AGO FOR POSSIBLE FUNGUS. SHE STATED THE RESULTS ARE ON HIS MY CHART ACCOUNT AND NOT SURE IF HE NEEDS MEDICATION OR NOT.

## 2014-08-19 MED ORDER — TERBINAFINE HCL 250 MG PO TABS: 250.0000 mg | ORAL_TABLET | Freq: Every day | ORAL | Status: DC

## 2014-08-19 NOTE — Telephone Encounter (Signed)
I called and informed the patient that Dr. Irving ShowsEgerton got your culture results. She said it was positive for fungus.  She wants to start you on Lamisil.  She recommends you get it from [facility] for a cheaper cost.  "Okay, that sounds great.  [facility]

## 2014-08-28 ENCOUNTER — Ambulatory Visit (INDEPENDENT_AMBULATORY_CARE_PROVIDER_SITE_OTHER): Payer: 59 | Admitting: Psychology

## 2014-08-28 DIAGNOSIS — F902 Attention-deficit hyperactivity disorder, combined type: Secondary | ICD-10-CM

## 2014-09-12 ENCOUNTER — Ambulatory Visit (INDEPENDENT_AMBULATORY_CARE_PROVIDER_SITE_OTHER): Payer: 59 | Admitting: Psychology

## 2014-09-12 DIAGNOSIS — F902 Attention-deficit hyperactivity disorder, combined type: Secondary | ICD-10-CM

## 2014-10-03 ENCOUNTER — Ambulatory Visit (INDEPENDENT_AMBULATORY_CARE_PROVIDER_SITE_OTHER): Payer: 59 | Admitting: Family

## 2014-10-03 ENCOUNTER — Encounter: Payer: Self-pay | Admitting: Family

## 2014-10-03 VITALS — BP 110/72 | HR 64 | Temp 97.7°F | Resp 18 | Ht 69.0 in | Wt 186.2 lb

## 2014-10-03 DIAGNOSIS — M7712 Lateral epicondylitis, left elbow: Secondary | ICD-10-CM | POA: Diagnosis not present

## 2014-10-03 DIAGNOSIS — R41840 Attention and concentration deficit: Secondary | ICD-10-CM

## 2014-10-03 DIAGNOSIS — M7710 Lateral epicondylitis, unspecified elbow: Secondary | ICD-10-CM | POA: Insufficient documentation

## 2014-10-03 MED ORDER — AMPHETAMINE-DEXTROAMPHETAMINE 5 MG PO TABS: 5.0000 mg | ORAL_TABLET | Freq: Two times a day (BID) | ORAL | Status: DC

## 2014-10-03 NOTE — Assessment & Plan Note (Signed)
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 Continue over-the-counter nonsteroidal anti-inflammatories as needed for pain. Recommended ice massage and stretching. May consider tennis elbow strap to help with work. Follow up if symptoms worsen or fail to improve.

## 2014-10-03 NOTE — Progress Notes (Signed)
Pre visit review using our clinic review tool, if applicable. No additional management support is needed unless otherwise documented below in the visit note. 

## 2014-10-03 NOTE — Patient Instructions (Addendum)
Thank you for choosing [facility].  Summary/Instructions:  Your prescription(s) have been submitted to your pharmacy or been printed and provided for you. Please take as directed and contact our office if you believe you are having problem(s) with the medication(s) or have any questions.  If your symptoms worsen or fail to improve, please contact our office for further instruction, or in case of emergency go directly to the emergency room at the closest medical facility.    Attention Deficit Hyperactivity Disorder Attention deficit hyperactivity disorder (ADHD) is a problem with behavior issues based on the way the brain functions (neurobehavioral disorder). It is a common reason for behavior and academic problems in school. SYMPTOMS  There are 3 types of ADHD. The 3 types and some of the symptoms include:  Inattentive.  Gets bored or distracted easily.  Loses or forgets things. Forgets to hand in homework.  Has trouble organizing or completing tasks.  Difficulty staying on task.  An inability to organize daily tasks and school work.  Leaving projects, chores, or homework unfinished.  Trouble paying attention or responding to details. Careless mistakes.  Difficulty following directions. Often seems like is not listening.  Dislikes activities that require sustained attention (like chores or homework).  Hyperactive-impulsive.  Feels like it is impossible to sit still or stay in a seat. Fidgeting with hands and feet.  Trouble waiting turn.  Talking too much or out of turn. Interruptive.  Speaks or acts impulsively.  Aggressive, disruptive behavior.  Constantly busy or on the go; noisy.  Often leaves seat when they are expected to remain seated.  Often runs or climbs where it is not appropriate, or feels very restless.  Combined.  Has symptoms of both of the above. Often children with ADHD feel discouraged about themselves and with school. They often perform  well below their abilities in school. As children get older, the excess motor activities can calm down, but the problems with paying attention and staying organized persist. Most children do not outgrow ADHD but with good treatment can learn to cope with the symptoms. DIAGNOSIS  When ADHD is suspected, the diagnosis should be made by professionals trained in ADHD. This professional will collect information about the individual suspected of having ADHD. Information must be collected from various settings where the person lives, works, or attends school.  Diagnosis will include:  Confirming symptoms began in childhood.  Ruling out other reasons for the child's behavior.  The health care providers will check with the child's school and check their medical records.  They will talk to teachers and parents.  Behavior rating scales for the child will be filled out by those dealing with the child on a daily basis. A diagnosis is made only after all information has been considered. TREATMENT  Treatment usually includes behavioral treatment, tutoring or extra support in school, and stimulant medicines. Because of the way a person's brain works with ADHD, these medicines decrease impulsivity and hyperactivity and increase attention. This is different than how they would work in a person who does not have ADHD. Other medicines used include antidepressants and certain blood pressure medicines. Most experts agree that treatment for ADHD should address all aspects of the person's functioning. Along with medicines, treatment should include structured classroom management at school. Parents should reward good behavior, provide constant discipline, and set limits. Tutoring should be available for the child as needed. ADHD is a lifelong condition. If untreated, the disorder can have long-term serious effects into adolescence and adulthood.  HOME CARE INSTRUCTIONS   Often with ADHD there is a lot of frustration  among family members dealing with the condition. Blame and anger are also feelings that are common. In many cases, because the problem affects the family as a whole, the entire family may need help. A therapist can help the family find better ways to handle the disruptive behaviors of the person with ADHD and promote change. If the person with ADHD is young, most of the therapist's work is with the parents. Parents will learn techniques for coping with and improving their child's behavior. Sometimes only the child with the ADHD needs counseling. Your health care providers can help you make these decisions.  Children with ADHD may need help learning how to organize. Some helpful tips include:  Keep routines the same every day from wake-up time to bedtime. Schedule all activities, including homework and playtime. Keep the schedule in a place where the person with ADHD will often see it. Mark schedule changes as far in advance as possible.  Schedule outdoor and indoor recreation.  Have a place for everything and keep everything in its place. This includes clothing, backpacks, and school supplies.  Encourage writing down assignments and bringing home needed books. Work with your child's teachers for assistance in organizing school work.  Offer your child a well-balanced diet. Breakfast that includes a balance of whole grains, protein, and fruits or vegetables is especially important for school performance. Children should avoid drinks with caffeine including:  Soft drinks.  Coffee.  Tea.  However, some older children (adolescents) may find these drinks helpful in improving their attention. Because it can also be common for adolescents with ADHD to become addicted to caffeine, talk with your health care provider about what is a safe amount of caffeine intake for your child.  Children with ADHD need consistent rules that they can understand and follow. If rules are followed, give small rewards.  Children with ADHD often receive, and expect, criticism. Look for good behavior and praise it. Set realistic goals. Give clear instructions. Look for activities that can foster success and self-esteem. Make time for pleasant activities with your child. Give lots of affection.  Parents are their children's greatest advocates. Learn as much as possible about ADHD. This helps you become a stronger and better advocate for your child. It also helps you educate your child's teachers and instructors if they feel inadequate in these areas. Parent support groups are often helpful. A national group with local chapters is called Children and Adults with Attention Deficit Hyperactivity Disorder (CHADD). SEEK MEDICAL CARE IF:  Your child has repeated muscle twitches, cough, or speech outbursts.  Your child has sleep problems.  Your child has a marked loss of appetite.  Your child develops depression.  Your child has new or worsening behavioral problems.  Your child develops dizziness.  Your child has a racing heart.  Your child has stomach pains.  Your child develops headaches. SEEK IMMEDIATE MEDICAL CARE IF:  Your child has been diagnosed with depression or anxiety and the symptoms seem to be getting worse.  Your child has been depressed and suddenly appears to have increased energy or motivation.  You are worried that your child is having a bad reaction to a medication he or she is taking for ADHD. Document Released: 06/03/2002 Document Revised: 06/18/2013 Document Reviewed: 02/18/2013 [facility] Patient Information 2015 [place], [place]. This information is not intended to replace advice given to you by your health care provider. Make sure you  discuss any questions you have with your health care provider.

## 2014-10-03 NOTE — Assessment & Plan Note (Signed)
Patient officially diagnosed with attention deficit hyperactivity disorder. Start Adderall 5 mg twice a day. Follow-up in one month. Controlled substance contract signed. Urine drug screen performed with results pending.

## 2014-10-03 NOTE — Progress Notes (Signed)
   Subjective:    Patient ID: Shawn Horton, male    DOB: 05/12/1976, 39 y.o.   MRN: [id number]  Chief Complaint  Patient presents with  . Follow-up    he does feel like he should be on something for ADD    HPI:  Shawn GreavesRobert Horton is a 39 y.o. male who presents today to follow-up on his ADD testing and for left elbow pain.   1) ADD: Patient was recently seen by behavioral health and diagnosed with ADHD. Medication was recommended. Results were sent to scanned for documentation purposes. Patient continues to experience the associated symptom of decreased attention. Indicates is staying about the same since his previous visit.  2) Elbow pain - this is a new problem. Associated symptom of pain located on the lateral side of his left elbow has been going on for about 2 weeks. Denies any trauma. Pain is described as sharp/dull and rated a 2/10 in pain intensity.  Has tried Aleve which has helped some.    No Known Allergies   Current Outpatient Prescriptions on File Prior to Visit  Medication Sig Dispense Refill  . terbinafine (LAMISIL) 250 MG tablet Take 1 tablet (250 mg total) by mouth daily. 90 tablet 0   No current facility-administered medications on file prior to visit.    Past Medical History  Diagnosis Date  . Chicken pox     Review of Systems  Constitutional: Negative for fever and chills.  Musculoskeletal:       Positive for elbow pain  Psychiatric/Behavioral: Positive for decreased concentration.      Objective:    BP 110/72 mmHg  Pulse 64  Temp(Src) 97.7 F (36.5 C) (Oral)  Resp 18  Ht 5\' 9"  (1.753 m)  Wt 186 lb 3.2 oz (84.46 kg)  BMI 27.48 kg/m2  SpO2 97% Nursing note and vital signs reviewed.  Physical Exam  Constitutional: He is oriented to person, place, and time. He appears well-developed and well-nourished. No distress.  Cardiovascular: Normal rate, regular rhythm, normal heart sounds and intact distal pulses.   Pulmonary/Chest: Effort normal and breath  sounds normal.  Musculoskeletal:  Left elbow: No obvious deformity, discoloration, or edema of left elbow noted. Palpable tenderness over her lateral epicondyle and extensor musculature. Patient displays full elbow and wrist range of motion. Some discomfort is felt with wrist extension. Pulses and sensation are intact and appropriate.  Neurological: He is alert and oriented to person, place, and time.  Skin: Skin is warm and dry.  Psychiatric: He has a normal mood and affect. His behavior is normal. Judgment and thought content normal.       Assessment & Plan:

## 2014-10-27 ENCOUNTER — Encounter: Payer: Self-pay | Admitting: Family

## 2014-11-03 ENCOUNTER — Ambulatory Visit (INDEPENDENT_AMBULATORY_CARE_PROVIDER_SITE_OTHER): Payer: BLUE CROSS/BLUE SHIELD | Admitting: Family

## 2014-11-03 ENCOUNTER — Encounter: Payer: Self-pay | Admitting: Family

## 2014-11-03 ENCOUNTER — Ambulatory Visit: Payer: 59 | Admitting: Family

## 2014-11-03 VITALS — BP 102/72 | HR 97 | Temp 98.2°F | Resp 18 | Ht 69.0 in | Wt 179.4 lb

## 2014-11-03 DIAGNOSIS — R41840 Attention and concentration deficit: Secondary | ICD-10-CM | POA: Diagnosis not present

## 2014-11-03 MED ORDER — AMPHETAMINE-DEXTROAMPHETAMINE 5 MG PO TABS
5.0000 mg | ORAL_TABLET | Freq: Two times a day (BID) | ORAL | Status: DC
Start: 2014-11-03 — End: 2014-12-08

## 2014-11-03 NOTE — Progress Notes (Signed)
Pre visit review using our clinic review tool, if applicable. No additional management support is needed unless otherwise documented below in the visit note. 

## 2014-11-03 NOTE — Assessment & Plan Note (Signed)
Attention is stable and improved with current regimen. Denies adverse side effects with none evident on physical exam. Continue current dosage of Adderall 5 mg. Follow-up in one month.

## 2014-11-03 NOTE — Patient Instructions (Signed)
Thank you for choosing [facility].  Summary/Instructions:  Your prescription(s) have been submitted to your pharmacy or been printed and provided for you. Please take as directed and contact our office if you believe you are having problem(s) with the medication(s) or have any questions.  If your symptoms worsen or fail to improve, please contact our office for further instruction, or in case of emergency go directly to the emergency room at the closest medical facility.     

## 2014-11-03 NOTE — Progress Notes (Signed)
   Subjective:    Patient ID: Shawn Horton, male    DOB: 07/03/1975, 89Jeronimo Greaves8 y.o.   MRN: [id number]  Chief Complaint  Patient presents with  . Medication Refill    needs refill of adderall, works really well for him    HPI:  Shawn GreavesRobert Horton is a 39 y.o. male with a PMH of lateral epicondylitis, tobacco use disorder, and attention deficit who presents today for an office follow up.  1) ADD - Patient was recently started on 5 mg of Adderall for attention deficit. Indicates that he is not overly stressed and can focus on tasks with the current medication. Taking the medication as prescribed and denies any adverse effects. Does note a slight decrease in effectiveness towards the end of the month, however he notes that medication was left out in the heat for a small period of time.  No Known Allergies   Current Outpatient Prescriptions on File Prior to Visit  Medication Sig Dispense Refill  . amphetamine-dextroamphetamine (ADDERALL) 5 MG tablet Take 1 tablet (5 mg total) by mouth 2 (two) times daily with a meal. 60 tablet 0  . terbinafine (LAMISIL) 250 MG tablet Take 1 tablet (250 mg total) by mouth daily. 90 tablet 0   No current facility-administered medications on file prior to visit.    Review of Systems  Constitutional: Negative for appetite change and unexpected weight change.  Respiratory: Negative for chest tightness and shortness of breath.   Cardiovascular: Negative for chest pain, palpitations and leg swelling.  Neurological: Negative for headaches.  Psychiatric/Behavioral: Negative for sleep disturbance and decreased concentration. The patient is not nervous/anxious.       Objective:    BP 102/72 mmHg  Pulse 97  Temp(Src) 98.2 F (36.8 C) (Oral)  Resp 18  Ht 5\' 9"  (1.753 m)  Wt 179 lb 6.4 oz (81.375 kg)  BMI 26.48 kg/m2  SpO2 96% Nursing note and vital signs reviewed.  Physical Exam  Constitutional: He is oriented to person, place, and time. He appears well-developed  and well-nourished. No distress.  Cardiovascular: Normal rate, regular rhythm, normal heart sounds and intact distal pulses.   Pulmonary/Chest: Effort normal and breath sounds normal.  Neurological: He is alert and oriented to person, place, and time.  Skin: Skin is warm and dry.  Psychiatric: He has a normal mood and affect. His behavior is normal. Judgment and thought content normal.       Assessment & Plan:

## 2014-11-07 ENCOUNTER — Ambulatory Visit: Payer: Self-pay | Admitting: Family

## 2014-11-18 ENCOUNTER — Telehealth: Payer: Self-pay | Admitting: *Deleted

## 2014-11-18 NOTE — Telephone Encounter (Signed)
Pt's wife called for refill of Lamisil.  I reviewed pt's medication and he was given #90 Lamisil in 07/2014.  I informed pt's wife the therapeutic dosing of Lamisil was routinely 90 doses and it would take 6-9 months to see grow out.  I encouraged pt's wife to have pt make an appt for 6-9 months after completion of Lamisil to check progress.  Pt's wife stated understanding.

## 2014-12-08 ENCOUNTER — Ambulatory Visit (INDEPENDENT_AMBULATORY_CARE_PROVIDER_SITE_OTHER): Payer: BLUE CROSS/BLUE SHIELD | Admitting: Family

## 2014-12-08 ENCOUNTER — Encounter: Payer: Self-pay | Admitting: Family

## 2014-12-08 VITALS — BP 130/72 | HR 84 | Temp 98.1°F | Resp 18 | Ht 69.0 in | Wt 179.0 lb

## 2014-12-08 DIAGNOSIS — R41840 Attention and concentration deficit: Secondary | ICD-10-CM

## 2014-12-08 MED ORDER — AMPHETAMINE-DEXTROAMPHETAMINE 20 MG PO TABS: 10.0000 mg | ORAL_TABLET | Freq: Two times a day (BID) | ORAL | Status: DC

## 2014-12-08 NOTE — Patient Instructions (Signed)
Thank you for choosing [facility].  Summary/Instructions:  Please increase your Adderall to 10 mg twice daily.  Your prescription(s) have been submitted to your pharmacy or been printed and provided for you. Please take as directed and contact our office if you believe you are having problem(s) with the medication(s) or have any questions.  If your symptoms worsen or fail to improve, please contact our office for further instruction, or in case of emergency go directly to the emergency room at the closest medical facility.

## 2014-12-08 NOTE — Progress Notes (Signed)
   Subjective:    Patient ID: Shawn Horton, male    DOB: 04-Jan-1976, 39 y.o.   MRN: [id number]  Chief Complaint  Patient presents with  . Medication Refill    needs refill of adderall    HPI:  Shawn Horton is a 39 y.o. male with a PMH of epicondylitis and attention deficit who presents today follow-up office visit.  1.) ADD - currently maintained on 5 mg of Adderall daily. Indicates that he feels as though he is growing accustomed to the medication and notes that his attention has been decreased over the past couple of weeks. Takes his medication as prescribed. Denies adverse side effects. Sleep disturbance is secondary to working at night. Reports normal appetite. Denies cardiovascular symptoms.    No Known Allergies   Current Outpatient Prescriptions on File Prior to Visit  Medication Sig Dispense Refill  . amphetamine-dextroamphetamine (ADDERALL) 5 MG tablet Take 1 tablet (5 mg total) by mouth 2 (two) times daily with a meal. 60 tablet 0  . terbinafine (LAMISIL) 250 MG tablet Take 1 tablet (250 mg total) by mouth daily. 90 tablet 0   No current facility-administered medications on file prior to visit.     Review of Systems  Constitutional: Negative for activity change, appetite change and unexpected weight change.  Respiratory: Negative for cough and chest tightness.   Cardiovascular: Negative for chest pain, palpitations and leg swelling.  Neurological: Negative for headaches.  Psychiatric/Behavioral: Positive for decreased concentration.      Objective:    BP 130/72 mmHg  Pulse 84  Temp(Src) 98.1 F (36.7 C) (Oral)  Resp 18  Ht 5\' 9"  (1.753 m)  Wt 179 lb (81.194 kg)  BMI 26.42 kg/m2  SpO2 97% Nursing note and vital signs reviewed.  Physical Exam  Constitutional: He is oriented to person, place, and time. He appears well-developed and well-nourished. No distress.  Cardiovascular: Normal rate, regular rhythm, normal heart sounds and intact distal pulses.     Pulmonary/Chest: Effort normal and breath sounds normal.  Neurological: He is alert and oriented to person, place, and time.  Skin: Skin is warm and dry.  Psychiatric: He has a normal mood and affect. His behavior is normal. Judgment and thought content normal.       Assessment & Plan:   Problem List Items Addressed This Visit      Other   Attention deficit - Primary    Indicates previously controlled attention has began to decrease as time goes on. Notes last couple of weeks his attention has began to wander again. Increase Adderall to 10 mg twice a day. Denies adverse side effects of current medication. Follow-up in one month.      Relevant Medications   amphetamine-dextroamphetamine (ADDERALL) 20 MG tablet

## 2014-12-08 NOTE — Assessment & Plan Note (Signed)
Indicates previously controlled attention has began to decrease as time goes on. Notes last couple of weeks his attention has began to wander again. Increase Adderall to 10 mg twice a day. Denies adverse side effects of current medication. Follow-up in one month.

## 2014-12-08 NOTE — Progress Notes (Signed)
Pre visit review using our clinic review tool, if applicable. No additional management support is needed unless otherwise documented below in the visit note. 

## 2015-01-09 ENCOUNTER — Ambulatory Visit (INDEPENDENT_AMBULATORY_CARE_PROVIDER_SITE_OTHER): Payer: BLUE CROSS/BLUE SHIELD | Admitting: Family

## 2015-01-09 ENCOUNTER — Encounter: Payer: Self-pay | Admitting: Family

## 2015-01-09 VITALS — BP 120/80 | HR 86 | Temp 97.6°F | Resp 18 | Ht 69.0 in | Wt 172.8 lb

## 2015-01-09 DIAGNOSIS — R41840 Attention and concentration deficit: Secondary | ICD-10-CM

## 2015-01-09 MED ORDER — AMPHETAMINE-DEXTROAMPHETAMINE 20 MG PO TABS: 10.0000 mg | ORAL_TABLET | Freq: Two times a day (BID) | ORAL | Status: DC

## 2015-01-09 NOTE — Progress Notes (Signed)
   Subjective:    Patient ID: Shawn Horton, male    DOB: 06/13/1976, [age] y.o.   MRN: [id number]  Chief Complaint  Patient presents with  . Medication Refill    adderall    HPI:  Shawn GreavesRobert Dalesandro is a 39 y.o. male with a PMH of lateral epicondylitis, attention deficit, and tobacco use who presents today for an office follow-up.    1.) ADD - Previously increased his adderall to 20 mg daily. Reports initial increase in attention which has taloried off towards the end of the month similar to previous experience.. Is a stressful time at work. Takes his medications as prescribed and denies adverse side effects including weight loss, change in appetite, chest pain, heart palpitations, or sleep disturbance.  No Known Allergies   Outpatient Prescriptions Prior to Visit  Medication Sig Dispense Refill  . terbinafine (LAMISIL) 250 MG tablet Take 1 tablet (250 mg total) by mouth daily. 90 tablet 0  . amphetamine-dextroamphetamine (ADDERALL) 20 MG tablet Take 0.5 tablets (10 mg total) by mouth 2 (two) times daily. 30 tablet 0   No facility-administered medications prior to visit.     Review of Systems  Constitutional: Negative for activity change, appetite change and unexpected weight change.  Respiratory: Negative for chest tightness and shortness of breath.   Cardiovascular: Negative for chest pain, palpitations and leg swelling.  Psychiatric/Behavioral: Positive for decreased concentration. Negative for sleep disturbance. The patient is not nervous/anxious.       Objective:    BP 120/80 mmHg  Pulse 86  Temp(Src) 97.6 F (36.4 C) (Oral)  Resp 18  Ht 5\' 9"  (1.753 m)  Wt 172 lb 12.8 oz (78.382 kg)  BMI 25.51 kg/m2  SpO2 96% Nursing note and vital signs reviewed.  Physical Exam  Constitutional: He is oriented to person, place, and time. He appears well-developed and well-nourished. No distress.  Cardiovascular: Normal rate, regular rhythm, normal heart sounds and intact distal pulses.    Pulmonary/Chest: Effort normal and breath sounds normal.  Neurological: He is alert and oriented to person, place, and time.  Skin: Skin is warm and dry.  Psychiatric: He has a normal mood and affect. His behavior is normal. Judgment and thought content normal.       Assessment & Plan:   Problem List Items Addressed This Visit      Other   Attention deficit - Primary    Continues to experience some decrease in attention towards the end of the month. Continue current dosage of Adderall and start a PRN dosage 1 daily as needed. Follow up in 1 month or sooner if symptoms of attention remain decreased with current regimen.       Relevant Medications   amphetamine-dextroamphetamine (ADDERALL) 20 MG tablet

## 2015-01-09 NOTE — Patient Instructions (Signed)
Thank you for choosing [facility].  Summary/Instructions:   Please continue to take your adderall 0.5 tablets 2 x per day and then add 0.5 tablets daily as needed.   Your prescription(s) have been submitted to your pharmacy or been printed and provided for you. Please take as directed and contact our office if you believe you are having problem(s) with the medication(s) or have any questions.  If your symptoms worsen or fail to improve, please contact our office for further instruction, or in case of emergency go directly to the emergency room at the closest medical facility.

## 2015-01-10 NOTE — Assessment & Plan Note (Signed)
Continues to experience some decrease in attention towards the end of the month. Continue current dosage of Adderall and start a PRN dosage 1 daily as needed. Follow up in 1 month or sooner if symptoms of attention remain decreased with current regimen.

## 2015-02-09 ENCOUNTER — Encounter: Payer: Self-pay | Admitting: Family

## 2015-02-09 ENCOUNTER — Ambulatory Visit (INDEPENDENT_AMBULATORY_CARE_PROVIDER_SITE_OTHER): Payer: BLUE CROSS/BLUE SHIELD | Admitting: Family

## 2015-02-09 VITALS — BP 110/70 | HR 82 | Temp 98.1°F | Resp 18 | Ht 69.0 in | Wt 179.0 lb

## 2015-02-09 DIAGNOSIS — R41840 Attention and concentration deficit: Secondary | ICD-10-CM

## 2015-02-09 MED ORDER — AMPHETAMINE-DEXTROAMPHETAMINE 20 MG PO TABS: 10.0000 mg | ORAL_TABLET | Freq: Two times a day (BID) | ORAL | Status: DC

## 2015-02-09 NOTE — Assessment & Plan Note (Signed)
Reports improvement with current dosage of Adderall. Takes the medication as prescribed and denies adverse side effects. Continue current dosage of Adderall. Prescriptions refilled for 3 months.

## 2015-02-09 NOTE — Patient Instructions (Signed)
Thank you for choosing [facility].  Summary/Instructions:  Your prescription(s) have been submitted to your pharmacy or been printed and provided for you. Please take as directed and contact our office if you believe you are having problem(s) with the medication(s) or have any questions.  If your symptoms worsen or fail to improve, please contact our office for further instruction, or in case of emergency go directly to the emergency room at the closest medical facility.     

## 2015-02-09 NOTE — Progress Notes (Signed)
   Subjective:    Patient ID: Shawn Horton, male    DOB: March 27, 1976, 38 y.o.   MRN: [id number]  Chief Complaint  Patient presents with  . Medication Refill    adderall    HPI:  Shawn Horton is a 39 y.o. male with a PMH of tobacco use, lateral epicondylitis and attention deficit who presents today for an office follow up.  1.) Attention deficit disorder - Previous added a PRN dose of adderall to his regimen as a result of decreased effectiveness. Takes the PRN dose just about everyday. Otherwise takes the medication as prescribed and reports improved concentration. Reports average amount of sleep and no changes in appetite. Denies cardiac related symptoms.  Wt Readings from Last 3 Encounters:  02/09/15 179 lb (81.194 kg)  01/09/15 172 lb 12.8 oz (78.382 kg)  12/08/14 179 lb (81.194 kg)      No Known Allergies   Current Outpatient Prescriptions on File Prior to Visit  Medication Sig Dispense Refill  . terbinafine (LAMISIL) 250 MG tablet Take 1 tablet (250 mg total) by mouth daily. 90 tablet 0   No current facility-administered medications on file prior to visit.    Review of Systems  Constitutional: Negative for diaphoresis, appetite change and unexpected weight change.  Respiratory: Negative for chest tightness and shortness of breath.   Cardiovascular: Negative for chest pain, palpitations and leg swelling.  Neurological: Negative for headaches.  Psychiatric/Behavioral: Negative for sleep disturbance and decreased concentration. The patient is not nervous/anxious.       Objective:    BP 110/70 mmHg  Pulse 82  Temp(Src) 98.1 F (36.7 C) (Oral)  Resp 18  Ht  (1.753 m)  Wt 179 lb (81.194 kg)  BMI 26.42 kg/m2  SpO2 97% Nursing note and vital signs reviewed.  Physical Exam  Constitutional: He is oriented to person, place, and time. He appears well-developed and well-nourished. No distress.  Cardiovascular: Normal rate, regular rhythm, normal heart sounds and  intact distal pulses.   Pulmonary/Chest: Effort normal and breath sounds normal.  Neurological: He is alert and oriented to person, place, and time.  Skin: Skin is warm and dry.  Psychiatric: He has a normal mood and affect. His behavior is normal. Judgment and thought content normal.       Assessment & Plan:   Problem List Items Addressed This Visit      Other   Attention deficit - Primary    Reports improvement with current dosage of Adderall. Takes the medication as prescribed and denies adverse side effects. Continue current dosage of Adderall. Prescriptions refilled for 3 months.       Relevant Medications   amphetamine-dextroamphetamine (ADDERALL) 20 MG tablet

## 2015-02-09 NOTE — Progress Notes (Signed)
Pre visit review using our clinic review tool, if applicable. No additional management support is needed unless otherwise documented below in the visit note. 

## 2015-03-20 ENCOUNTER — Telehealth: Payer: Self-pay | Admitting: Family

## 2015-03-20 NOTE — Telephone Encounter (Signed)
pts wife called and [employer] is not wanting to pay for the lab work that was done on 10/03/14 because they thinks it's not medically necessary.  To her understanding it was because of the medication he needed. Can you send a letter stating that it was medically necessary and fax it to [phone]. Pt had [employer] at the time of this appt.  He now has a Stage manager. Please call pts wife to let her know if this is done She can be reached at [phone]

## 2015-03-23 NOTE — Telephone Encounter (Signed)
There is no lab work that was done on that day. I have never ordered labs for patient.

## 2015-03-24 NOTE — Telephone Encounter (Signed)
Called wife back LVM letting her know response below.

## 2015-05-13 ENCOUNTER — Ambulatory Visit (INDEPENDENT_AMBULATORY_CARE_PROVIDER_SITE_OTHER): Payer: BLUE CROSS/BLUE SHIELD | Admitting: Family

## 2015-05-13 ENCOUNTER — Encounter: Payer: Self-pay | Admitting: Family

## 2015-05-13 VITALS — BP 130/88 | HR 94 | Temp 98.0°F | Resp 18 | Ht 69.0 in | Wt 177.0 lb

## 2015-05-13 DIAGNOSIS — Z23 Encounter for immunization: Secondary | ICD-10-CM | POA: Diagnosis not present

## 2015-05-13 DIAGNOSIS — R41840 Attention and concentration deficit: Secondary | ICD-10-CM | POA: Diagnosis not present

## 2015-05-13 MED ORDER — AMPHETAMINE-DEXTROAMPHETAMINE 20 MG PO TABS: 10.0000 mg | ORAL_TABLET | Freq: Two times a day (BID) | ORAL | Status: DC

## 2015-05-13 NOTE — Progress Notes (Signed)
Pre visit review using our clinic review tool, if applicable. No additional management support is needed unless otherwise documented below in the visit note. 

## 2015-05-13 NOTE — Patient Instructions (Signed)
Thank you for choosing [facility].  Summary/Instructions:  Your prescription(s) have been submitted to your pharmacy or been printed and provided for you. Please take as directed and contact our office if you believe you are having problem(s) with the medication(s) or have any questions.  If your symptoms worsen or fail to improve, please contact our office for further instruction, or in case of emergency go directly to the emergency room at the closest medical facility.   Please continue to take your medications as prescribed.  

## 2015-05-13 NOTE — Assessment & Plan Note (Signed)
Stable with current dose of Adderall and denies adverse side effects. Continue current dosage of Adderall 3 months. [place] controlled substance database reviewed with no irregularities.

## 2015-05-13 NOTE — Progress Notes (Signed)
   Subjective:    Patient ID: Shawn Horton, male    DOB: 02/02/1976, 39 y.o.   MRN: [id number]  Chief Complaint  Patient presents with  . Medication Refill    HPI:  Shawn Horton is a 39 y.o. male who  has a past medical history of Chicken pox. and presents today for an office follow up.   1.) ADHD - Currently managed on Adderall. Takes the medication as prescribed and denies adverse side effects. Attention appears controlled with the current regimen. Sleeps about 5-6 nights per night. No significant weight loss. Denies any chest pain, shortness of breath, or palpitations.   Wt Readings from Last 3 Encounters:  05/13/15 177 lb (80.287 kg)  02/09/15 179 lb (81.194 kg)  01/09/15 172 lb 12.8 oz (78.382 kg)    No Known Allergies   Current Outpatient Prescriptions on File Prior to Visit  Medication Sig Dispense Refill  . terbinafine (LAMISIL) 250 MG tablet Take 1 tablet (250 mg total) by mouth daily. 90 tablet 0   No current facility-administered medications on file prior to visit.    Review of Systems  Constitutional: Negative for diaphoresis, appetite change and unexpected weight change.  Respiratory: Negative for chest tightness and shortness of breath.   Cardiovascular: Negative for chest pain, palpitations and leg swelling.  Neurological: Negative for headaches.  Psychiatric/Behavioral: Negative for sleep disturbance and decreased concentration. The patient is not nervous/anxious.       Objective:    BP 130/88 mmHg  Pulse 94  Temp(Src) 98 F (36.7 C) (Oral)  Resp 18  Ht 5\' 9"  (1.753 m)  Wt 177 lb (80.287 kg)  BMI 26.13 kg/m2  SpO2 96% Nursing note and vital signs reviewed.  Physical Exam  Constitutional: He is oriented to person, place, and time. He appears well-developed and well-nourished. No distress.  Cardiovascular: Normal rate, regular rhythm, normal heart sounds and intact distal pulses.   Pulmonary/Chest: Effort normal and breath sounds normal.    Neurological: He is alert and oriented to person, place, and time.  Skin: Skin is warm and dry.  Psychiatric: He has a normal mood and affect. His behavior is normal. Judgment and thought content normal.       Assessment & Plan:   Problem List Items Addressed This Visit      Other   Attention deficit - Primary    Stable with current dose of Adderall and denies adverse side effects. Continue current dosage of Adderall 3 months. [place] controlled substance database reviewed with no irregularities.      Relevant Medications   amphetamine-dextroamphetamine (ADDERALL) 20 MG tablet

## 2015-08-17 ENCOUNTER — Ambulatory Visit (INDEPENDENT_AMBULATORY_CARE_PROVIDER_SITE_OTHER): Payer: BLUE CROSS/BLUE SHIELD | Admitting: Family

## 2015-08-17 ENCOUNTER — Encounter: Payer: Self-pay | Admitting: Family

## 2015-08-17 VITALS — BP 108/72 | HR 83 | Resp 16 | Ht 69.0 in | Wt 173.0 lb

## 2015-08-17 DIAGNOSIS — R41840 Attention and concentration deficit: Secondary | ICD-10-CM

## 2015-08-17 MED ORDER — AMPHETAMINE-DEXTROAMPHETAMINE 20 MG PO TABS: 10.0000 mg | ORAL_TABLET | Freq: Two times a day (BID) | ORAL | Status: DC

## 2015-08-17 NOTE — Progress Notes (Signed)
Pre visit review using our clinic review tool, if applicable. No additional management support is needed unless otherwise documented below in the visit note. 

## 2015-08-17 NOTE — Progress Notes (Signed)
   Subjective:    Patient ID: Shawn Horton, male    DOB: [birth year], 40 y.o.   MRN: [id number]  Chief Complaint  Patient presents with  . Medication Refill    HPI:  Shawn Horton is a 40 y.o. male who  has a past medical history of Chicken pox. and presents today for a follow up office visit.   ADD - currently maintained on Adderall. Takes medications prescribed and denies adverse side effects. Averaging approximately 3-4  hours of sleep per day secondary to changing to the nightshift. No significant weight loss or change in appetite. Denies chest pain, shortness of breath, or heart palpitations. Indicates his attention is well controlled with current regimen.  No Known Allergies   Current Outpatient Prescriptions on File Prior to Visit  Medication Sig Dispense Refill  . terbinafine (LAMISIL) 250 MG tablet Take 1 tablet (250 mg total) by mouth daily. 90 tablet 0   No current facility-administered medications on file prior to visit.    Review of Systems  Constitutional: Negative for diaphoresis, appetite change and unexpected weight change.  Respiratory: Negative for chest tightness and shortness of breath.   Cardiovascular: Negative for chest pain, palpitations and leg swelling.  Neurological: Negative for headaches.  Psychiatric/Behavioral: Negative for sleep disturbance and decreased concentration. The patient is not nervous/anxious.       Objective:    BP 108/72 mmHg  Pulse 83  Resp 16  Ht  (1.753 m)  Wt 173 lb (78.472 kg)  BMI 25.54 kg/m2  SpO2 98% Nursing note and vital signs reviewed.  Physical Exam  Constitutional: He is oriented to person, place, and time. He appears well-developed and well-nourished. No distress.  Cardiovascular: Normal rate, regular rhythm, normal heart sounds and intact distal pulses.   Pulmonary/Chest: Effort normal and breath sounds normal.  Neurological: He is alert and oriented to person, place, and time.  Skin: Skin is warm and  dry.  Psychiatric: He has a normal mood and affect. His behavior is normal. Judgment and thought content normal.       Assessment & Plan:   Problem List Items Addressed This Visit      Other   Attention deficit - Primary    Attention deficit stable with current regimen with no adverse side effects. [place] controlled substance database reviewed with no irregularities. Continue current dosage of Adderall. Follow-up in 3 months.      Relevant Medications   amphetamine-dextroamphetamine (ADDERALL) 20 MG tablet

## 2015-08-17 NOTE — Patient Instructions (Signed)
Thank you for choosing [facility].  Summary/Instructions:  Your prescription(s) have been submitted to your pharmacy or been printed and provided for you. Please take as directed and contact our office if you believe you are having problem(s) with the medication(s) or have any questions.  Please continue to take your medications as prescribed.  Follow up with cost of brand name medication cost or generic medication that works.

## 2015-08-17 NOTE — Assessment & Plan Note (Signed)
Attention deficit stable with current regimen with no adverse side effects. [place] controlled substance database reviewed with no irregularities. Continue current dosage of Adderall. Follow-up in 3 months.

## 2015-09-17 ENCOUNTER — Encounter: Payer: Self-pay | Admitting: Family

## 2015-09-17 ENCOUNTER — Ambulatory Visit (INDEPENDENT_AMBULATORY_CARE_PROVIDER_SITE_OTHER): Payer: BLUE CROSS/BLUE SHIELD | Admitting: Family

## 2015-09-17 VITALS — BP 130/82 | HR 87 | Temp 97.7°F | Resp 16 | Ht 69.0 in | Wt 180.0 lb

## 2015-09-17 DIAGNOSIS — R41840 Attention and concentration deficit: Secondary | ICD-10-CM

## 2015-09-17 MED ORDER — AMPHETAMINE-DEXTROAMPHETAMINE 20 MG PO TABS: 10.0000 mg | ORAL_TABLET | Freq: Two times a day (BID) | ORAL | Status: DC

## 2015-09-17 NOTE — Patient Instructions (Signed)
Thank you for choosing [facility].  Summary/Instructions:  Please continue to take your medications as prescribed.   Your prescription(s) have been submitted to your pharmacy or been printed and provided for you. Please take as directed and contact our office if you believe you are having problem(s) with the medication(s) or have any questions.  If your symptoms worsen or fail to improve, please contact our office for further instruction, or in case of emergency go directly to the emergency room at the closest medical facility.     

## 2015-09-17 NOTE — Progress Notes (Signed)
Pre visit review using our clinic review tool, if applicable. No additional management support is needed unless otherwise documented below in the visit note. 

## 2015-09-17 NOTE — Progress Notes (Signed)
   Subjective:    Patient ID: Shawn Horton, male    DOB: 10/07/1975, 40 y.o.   MRN: [id number]  Chief Complaint  Patient presents with  . Medication Refill    adderall    HPI:  Shawn GreavesRobert Wint is a 40 y.o. male who  has a past medical history of Chicken pox. and presents today for an office follow up.  ADD Is currently maintained on Adderall. Reports taking the medication as prescribed and denies adverse side effects. Sleeping approximately 6-7 hours per night.No significant appetite or weight changes. No chest pain, shortness of breath, or heart palpitations. Symptoms are adequately controlled with current regimen.   No Known Allergies   Current Outpatient Prescriptions on File Prior to Visit  Medication Sig Dispense Refill  . terbinafine (LAMISIL) 250 MG tablet Take 1 tablet (250 mg total) by mouth daily. 90 tablet 0   No current facility-administered medications on file prior to visit.    Review of Systems  Constitutional: Negative for diaphoresis, appetite change and unexpected weight change.  Eyes:       Negative for changes in vision.  Respiratory: Negative for chest tightness and shortness of breath.   Cardiovascular: Negative for chest pain, palpitations and leg swelling.  Neurological: Negative for headaches.  Psychiatric/Behavioral: Negative for sleep disturbance and decreased concentration. The patient is not nervous/anxious.       Objective:    BP 130/82 mmHg  Pulse 87  Temp(Src) 97.7 F (36.5 C) (Oral)  Resp 16  Ht 5\' 9"  (1.753 m)  Wt 180 lb (81.647 kg)  BMI 26.57 kg/m2  SpO2 98% Nursing note and vital signs reviewed.  Physical Exam  Constitutional: He is oriented to person, place, and time. He appears well-developed and well-nourished. No distress.  Cardiovascular: Normal rate, regular rhythm, normal heart sounds and intact distal pulses.   Pulmonary/Chest: Effort normal and breath sounds normal.  Neurological: He is alert and oriented to person, place,  and time.  Skin: Skin is warm and dry.  Psychiatric: He has a normal mood and affect. His behavior is normal. Judgment and thought content normal.       Assessment & Plan:   Problem List Items Addressed This Visit      Other   Attention deficit - Primary    Stable with current dosage of Adderall. No side effects. Continue current dosage of Adderall. [place] controlled substance database reviewed with no irregularities. Follow-up in 3 months.      Relevant Medications   amphetamine-dextroamphetamine (ADDERALL) 20 MG tablet

## 2015-09-17 NOTE — Assessment & Plan Note (Signed)
Stable with current dosage of Adderall. No side effects. Continue current dosage of Adderall. [place] controlled substance database reviewed with no irregularities. Follow-up in 3 months.

## 2015-12-31 ENCOUNTER — Other Ambulatory Visit: Payer: Self-pay | Admitting: Family

## 2016-01-05 ENCOUNTER — Telehealth: Payer: Self-pay | Admitting: Family

## 2016-01-05 DIAGNOSIS — R41840 Attention and concentration deficit: Secondary | ICD-10-CM

## 2016-01-05 MED ORDER — AMPHETAMINE-DEXTROAMPHETAMINE 20 MG PO TABS: 10.0000 mg | ORAL_TABLET | Freq: Two times a day (BID) | ORAL | Status: DC

## 2016-01-05 NOTE — Telephone Encounter (Signed)
Patient is working out of town.  The soonest I could find him an appt is 7/28.  Patient is requesting script for adderall to get him through until that time.

## 2016-01-05 NOTE — Telephone Encounter (Signed)
Prescription printed for pickup

## 2016-01-06 NOTE — Telephone Encounter (Signed)
Notified patient.

## 2016-01-08 ENCOUNTER — Ambulatory Visit: Payer: Self-pay | Admitting: Family

## 2016-01-15 ENCOUNTER — Ambulatory Visit: Payer: BLUE CROSS/BLUE SHIELD | Admitting: Family

## 2016-01-22 ENCOUNTER — Encounter: Payer: Self-pay | Admitting: Family

## 2016-01-22 ENCOUNTER — Ambulatory Visit (INDEPENDENT_AMBULATORY_CARE_PROVIDER_SITE_OTHER): Payer: BLUE CROSS/BLUE SHIELD | Admitting: Family

## 2016-01-22 DIAGNOSIS — R41840 Attention and concentration deficit: Secondary | ICD-10-CM

## 2016-01-22 MED ORDER — AMPHETAMINE-DEXTROAMPHETAMINE 20 MG PO TABS: 10.0000 mg | ORAL_TABLET | Freq: Two times a day (BID) | ORAL | 0 refills | Status: DC

## 2016-01-22 NOTE — Patient Instructions (Signed)
Thank you for choosing [facility].  Summary/Instructions:  Continue to take your medications as prescribed.  Your prescription(s) have been submitted to your pharmacy or been printed and provided for you. Please take as directed and contact our office if you believe you are having problem(s) with the medication(s) or have any questions.  If your symptoms worsen or fail to improve, please contact our office for further instruction, or in case of emergency go directly to the emergency room at the closest medical facility.     

## 2016-01-22 NOTE — Progress Notes (Signed)
Subjective:    Patient ID: Shawn Horton, male    DOB: Mar 26, 1976, 40 y.o.   MRN: [id number]  Chief Complaint  Patient presents with  . Medication Refill    adderall    HPI:  Shawn Horton is a 40 y.o. male who  has a past medical history of Chicken pox. and presents today for a follow up office visit.   ADHD - currently maintained on Adderall. Reports taking medication as prescribed and denies adverse side effects. Sleeping approximately 5-6 hours per night. Denies symptoms and changes in weight or appetite. No chest pain, shortness of breath, or heart palpitations. Symptoms are generally well controlled with current regimen.   No Known Allergies   Current Outpatient Prescriptions on File Prior to Visit  Medication Sig Dispense Refill  . terbinafine (LAMISIL) 250 MG tablet Take 1 tablet (250 mg total) by mouth daily. 90 tablet 0   No current facility-administered medications on file prior to visit.     Review of Systems  Constitutional: Negative for appetite change, diaphoresis and unexpected weight change.  Respiratory: Negative for chest tightness and shortness of breath.   Cardiovascular: Negative for chest pain, palpitations and leg swelling.  Neurological: Negative for headaches.  Psychiatric/Behavioral: Negative for decreased concentration and sleep disturbance. The patient is not nervous/anxious.       Objective:    BP 124/80 (BP Location: Left Arm, Patient Position: Sitting, Cuff Size: Normal)   Pulse 90   Temp 98.5 F (36.9 C) (Oral)   Resp 16   Ht  (1.753 m)   Wt 180 lb (81.6 kg)   SpO2 97%   BMI 26.58 kg/m  Nursing note and vital signs reviewed.  Physical Exam  Constitutional: He is oriented to person, place, and time. He appears well-developed and well-nourished. No distress.  Cardiovascular: Normal rate, regular rhythm, normal heart sounds and intact distal pulses.   Pulmonary/Chest: Effort normal and breath sounds normal.  Neurological: He is  alert and oriented to person, place, and time.  Skin: Skin is warm and dry.  Psychiatric: He has a normal mood and affect. His behavior is normal. Judgment and thought content normal.       Assessment & Plan:   Problem List Items Addressed This Visit      Other   Attention deficit    Attention deficit stable with current regimen with no adverse side effects. Reports sleeping and eating well. No cardiac symptoms. Continue current dosage of Adderall. [place] controlled substance database reviewed with no irregularities.      Relevant Medications   amphetamine-dextroamphetamine (ADDERALL) 20 MG tablet    Other Visit Diagnoses   None.      I am having Mr. Shawn Horton maintain his terbinafine and amphetamine-dextroamphetamine.   Meds ordered this encounter  Medications  . DISCONTD: amphetamine-dextroamphetamine (ADDERALL) 20 MG tablet    Sig: Take 0.5 tablets (10 mg total) by mouth 2 (two) times daily. And 0.5 tablets 1 time daily as needed.    Dispense:  40 tablet    Refill:  0    Order Specific Question:   Supervising Provider    Answer:   Hillard Danker A [[id number]]  . DISCONTD: amphetamine-dextroamphetamine (ADDERALL) 20 MG tablet    Sig: Take 0.5 tablets (10 mg total) by mouth 2 (two) times daily. And 0.5 tablets 1 time daily as needed.    DispJeronimo Greavestablet    Refill:  0    Do not fill until 02/22/16  Order Specific Question:   Supervising Provider    Answer:   Hillard Danker A [[id number]]  . DISCONTD: amphetamine-dextroamphetamine (ADDERALL) 20 MG tablet    Sig: Take 0.5 tablets (10 mg total) by mouth 2 (two) times daily. And 0.5 tablets 1 time daily as needed.    Dispense:  40 tablet    Refill:  0    Do not fill until 03/24/16    Order Specific Question:   Supervising Provider    Answer:   Hillard Danker A [[id number]]  . amphetamine-dextroamphetamine (ADDERALL) 20 MG tablet    Sig: Take 0.5 tablets (10 mg total) by mouth 2 (two) times daily. And 0.5 tablets 1  time daily as needed.    Dispense:  40 tablet    Refill:  0    Do not fill until 04/21/16    Order Specific Question:   Supervising Provider    Answer:   Hillard Danker A [[id number]]     Follow-up: Return in about 4 months (around 05/24/2016), or if symptoms worsen or fail to improve.  Jeanine Luz, FNP

## 2016-01-22 NOTE — Assessment & Plan Note (Signed)
Attention deficit stable with current regimen with no adverse side effects. Reports sleeping and eating well. No cardiac symptoms. Continue current dosage of Adderall. [place] controlled substance database reviewed with no irregularities.

## 2016-06-10 ENCOUNTER — Encounter: Payer: Self-pay | Admitting: Family

## 2016-06-10 ENCOUNTER — Ambulatory Visit (INDEPENDENT_AMBULATORY_CARE_PROVIDER_SITE_OTHER): Payer: BLUE CROSS/BLUE SHIELD | Admitting: Family

## 2016-06-10 VITALS — BP 122/82 | HR 98 | Temp 98.4°F | Resp 16 | Ht 69.0 in | Wt 178.0 lb

## 2016-06-10 DIAGNOSIS — R41840 Attention and concentration deficit: Secondary | ICD-10-CM | POA: Diagnosis not present

## 2016-06-10 DIAGNOSIS — Z23 Encounter for immunization: Secondary | ICD-10-CM

## 2016-06-10 MED ORDER — AMPHETAMINE-DEXTROAMPHETAMINE 20 MG PO TABS: 10.0000 mg | ORAL_TABLET | Freq: Two times a day (BID) | ORAL | 0 refills | Status: DC

## 2016-06-10 NOTE — Patient Instructions (Signed)
Thank you for choosing [facility].  SUMMARY AND INSTRUCTIONS:  Please let us know the generic medication.   Medication:  Please continue to take your medication as prescribed.   Your prescription(s) have been submitted to your pharmacy or been printed and provided for you. Please take as directed and contact our office if you believe you are having problem(s) with the medication(s) or have any questions.   Follow up:  If your symptoms worsen or fail to improve, please contact our office for further instruction, or in case of emergency go directly to the emergency room at the closest medical facility.

## 2016-06-10 NOTE — Progress Notes (Signed)
Subjective:    Patient ID: Shawn Horton, male    DOB: 07/12/1975, 40 y.o.   MRN: [id number]  Chief Complaint  Patient presents with  . Medication Refill    adderall    HPI:  Shawn Horton is a 40 y.o. male who  has a past medical history of Chicken pox. and presents today for a follow up office visit.   Currently maintained on Adderall and reports taking the medication as prescribed and denies adverse side effects. Concentration and symptoms are generally well controlled with the current dosage of medication. Has noted some decreased control since pharmacy changed generic brands. Sleeping approximately 6-8 hours per night. No significant changes to appetite or weight. No headaches, chest pain, shortness of breath, or heart palpitations.    No Known Allergies    Outpatient Medications Prior to Visit  Medication Sig Dispense Refill  . terbinafine (LAMISIL) 250 MG tablet Take 1 tablet (250 mg total) by mouth daily. 90 tablet 0  . amphetamine-dextroamphetamine (ADDERALL) 20 MG tablet Take 0.5 tablets (10 mg total) by mouth 2 (two) times daily. And 0.5 tablets 1 time daily as needed. 40 tablet 0   No facility-administered medications prior to visit.      Review of Systems  Constitutional: Negative for appetite change, diaphoresis and unexpected weight change.  Respiratory: Negative for chest tightness and shortness of breath.   Cardiovascular: Negative for chest pain, palpitations and leg swelling.  Neurological: Negative for headaches.  Psychiatric/Behavioral: Negative for decreased concentration and sleep disturbance. The patient is not nervous/anxious.       Objective:    BP 122/82 (BP Location: Left Arm, Patient Position: Sitting, Cuff Size: Normal)   Pulse 98   Temp 98.4 F (36.9 C) (Oral)   Resp 16   Ht 5\' 9"  (1.753 m)   Wt 178 lb (80.7 kg)   SpO2 97%   BMI 26.29 kg/m  Nursing note and vital signs reviewed.  Physical Exam  Constitutional: He is oriented to  person, place, and time. He appears well-developed and well-nourished. No distress.  Cardiovascular: Normal rate, regular rhythm, normal heart sounds and intact distal pulses.   Pulmonary/Chest: Effort normal and breath sounds normal.  Neurological: He is alert and oriented to person, place, and time.  Skin: Skin is warm and dry.  Psychiatric: He has a normal mood and affect. His behavior is normal. Judgment and thought content normal.       Assessment & Plan:   Problem List Items Addressed This Visit      Other   Attention deficit - Primary    ADD appears stable with current regimen. Does have some challenges with the current generic which is new. Will check on generic medications. Sleeping and eating well with no cardiac symptoms. Continue current dosage of Adderall. [place] Controlled Substance Database reviewed with no irregularities.       Relevant Medications   amphetamine-dextroamphetamine (ADDERALL) 20 MG tablet    Other Visit Diagnoses    Encounter for immunization       Relevant Orders   Flu Vaccine QUAD 36+ mos IM (Completed)       I am having Mr. Horton maintain his terbinafine and amphetamine-dextroamphetamine.   Meds ordered this encounter  Medications  . DISCONTD: amphetamine-dextroamphetamine (ADDERALL) 20 MG tablet    Sig: Take 0.5 tablets (10 mg total) by mouth 2 (two) times daily. And 0.5 tablets 1 time daily as needed.    Dispense:  40 tablet    Refill:  0    Do not fill until 04/21/16    Order Specific Question:   Supervising Provider    Answer:   Shawn Horton, ELIZABETH A [[id number]]  . amphetamine-dextroamphetamine (ADDERALL) 20 MG tablet    Sig: Take 0.5 tablets (10 mg total) by mouth 2 (two) times daily. And 0.5 tablets 1 time daily as needed.    Dispense:  40 tablet    Refill:  0    Fill on or after 06/19/16    Order Specific Question:   Supervising Provider    Answer:   Shawn Horton, ELIZABETH A [[id number]]     Follow-up: Return in about 3 months (around  09/08/2016), or if symptoms worsen or fail to improve.  Shawn Horton, Shawn, Shawn Horton

## 2016-06-10 NOTE — Assessment & Plan Note (Signed)
ADD appears stable with current regimen. Does have some challenges with the current generic which is new. Will check on generic medications. Sleeping and eating well with no cardiac symptoms. Continue current dosage of Adderall. [place] Controlled Substance Database reviewed with no irregularities.

## 2016-08-22 ENCOUNTER — Ambulatory Visit (INDEPENDENT_AMBULATORY_CARE_PROVIDER_SITE_OTHER): Payer: BLUE CROSS/BLUE SHIELD | Admitting: Family

## 2016-08-22 ENCOUNTER — Encounter: Payer: Self-pay | Admitting: Family

## 2016-08-22 DIAGNOSIS — F900 Attention-deficit hyperactivity disorder, predominantly inattentive type: Secondary | ICD-10-CM

## 2016-08-22 MED ORDER — AMPHETAMINE-DEXTROAMPHETAMINE 20 MG PO TABS: 10.0000 mg | ORAL_TABLET | Freq: Two times a day (BID) | ORAL | 0 refills | Status: DC

## 2016-08-22 NOTE — Patient Instructions (Addendum)
Thank you for choosing [facility].  SUMMARY AND INSTRUCTIONS:  Medication:  Please continue to take your medication as prescribed.  Your prescription(s) have been submitted to your pharmacy or been printed and provided for you. Please take as directed and contact our office if you believe you are having problem(s) with the medication(s) or have any questions.  Follow up:  If your symptoms worsen or fail to improve, please contact our office for further instruction, or in case of emergency go directly to the emergency room at the closest medical facility.      

## 2016-08-22 NOTE — Progress Notes (Signed)
Subjective:    Patient ID: Shawn Horton, male    DOB: 10/01/1975, 41 y.o.   MRN: [id number]  Chief Complaint  Patient presents with  . Medication Refill    adderall     HPI:  Shawn Horton is a 41 y.o. male who  has a past medical history of Chicken pox. and presents today for an office follow up.   ADHD - Currently maintained on Adderall and reports taking the medication as prescribed and denies adverse side effects. Concentration and symptoms are generally well controlled with the current dosage of medication. Sleeping approximately 6-8 hours per night. No significant changes to appetite or weight. No headaches, chest pain, shortness of breath, or heart palpitations.    No Known Allergies    Outpatient Medications Prior to Visit  Medication Sig Dispense Refill  . amphetamine-dextroamphetamine (ADDERALL) 20 MG tablet Take 0.5 tablets (10 mg total) by mouth 2 (two) times daily. And 0.5 tablets 1 time daily as needed. 40 tablet 0  . terbinafine (LAMISIL) 250 MG tablet Take 1 tablet (250 mg total) by mouth daily. 90 tablet 0   No facility-administered medications prior to visit.     Review of Systems  Constitutional: Negative for appetite change, diaphoresis and unexpected weight change.  Respiratory: Negative for chest tightness and shortness of breath.   Cardiovascular: Negative for chest pain, palpitations and leg swelling.  Neurological: Negative for headaches.  Psychiatric/Behavioral: Negative for decreased concentration and sleep disturbance. The patient is not nervous/anxious.       Objective:    BP 112/74 (BP Location: Left Arm, Patient Position: Sitting, Cuff Size: Normal)   Pulse (!) 108   Temp 98.1 F (36.7 C) (Oral)   Resp 16   Ht 5\' 9"  (1.753 m)   Wt 174 lb (78.9 kg)   SpO2 98%   BMI 25.70 kg/m  Nursing note and vital signs reviewed.  Physical Exam  Constitutional: He is oriented to person, place, and time. He appears well-developed and well-nourished.  No distress.  Cardiovascular: Normal rate, regular rhythm, normal heart sounds and intact distal pulses.  Exam reveals no gallop and no friction rub.   No murmur heard. Pulmonary/Chest: Effort normal and breath sounds normal.  Neurological: He is alert and oriented to person, place, and time.  Skin: Skin is warm and dry.  Psychiatric: He has a normal mood and affect. His behavior is normal. Judgment and thought content normal.       Assessment & Plan:   Problem List Items Addressed This Visit      Other   ADHD    ADHD appears adequately controlled with current medication regimen with no adverse side effects. Eating and sleeping well. No cardiac symptoms. [place] Controlled Substance Database reviewed with no irregularities. Continue current dosage of Adderall.      Relevant Medications   amphetamine-dextroamphetamine (ADDERALL) 20 MG tablet       I have discontinued Mr. Tilman NeatWalker's terbinafine. I am also having him maintain his amphetamine-dextroamphetamine.   Meds ordered this encounter  Medications  . DISCONTD: amphetamine-dextroamphetamine (ADDERALL) 20 MG tablet    Sig: Take 0.5 tablets (10 mg total) by mouth 2 (two) times daily. And 0.5 tablets 1 time daily as needed.    Dispense:  40 tablet    Refill:  0    Order Specific Question:   Supervising Provider    Answer:   Hillard DankerRAWFORD, ELIZABETH A [[id number]]  . DISCONTD: amphetamine-dextroamphetamine (ADDERALL) 20 MG tablet    Sig: Take  0.5 tablets (10 mg total) by mouth 2 (two) times daily. And 0.5 tablets 1 time daily as needed.    Dispense:  40 tablet    Refill:  0    Fill on or after 09/21/16    Order Specific Question:   Supervising Provider    Answer:   Hillard Danker A [[id number]]  . amphetamine-dextroamphetamine (ADDERALL) 20 MG tablet    Sig: Take 0.5 tablets (10 mg total) by mouth 2 (two) times daily. And 0.5 tablets 1 time daily as needed.    Dispense:  40 tablet    Refill:  0    Fill on or after 10/21/2016    Order  Specific Question:   Supervising Provider    Answer:   Hillard Danker A [[id number]]     Follow-up: Return in about 3 months (around 11/19/2016).  Jeanine Luz, FNP

## 2016-08-22 NOTE — Assessment & Plan Note (Signed)
ADHD appears adequately controlled with current medication regimen with no adverse side effects. Eating and sleeping well. No cardiac symptoms. [place] Controlled Substance Database reviewed with no irregularities. Continue current dosage of Adderall.

## 2016-11-03 ENCOUNTER — Ambulatory Visit (INDEPENDENT_AMBULATORY_CARE_PROVIDER_SITE_OTHER): Payer: BLUE CROSS/BLUE SHIELD | Admitting: Nurse Practitioner

## 2016-11-03 ENCOUNTER — Encounter: Payer: Self-pay | Admitting: Nurse Practitioner

## 2016-11-03 ENCOUNTER — Other Ambulatory Visit: Payer: BLUE CROSS/BLUE SHIELD

## 2016-11-03 VITALS — BP 112/80 | HR 79 | Temp 98.0°F | Ht 69.0 in | Wt 172.0 lb

## 2016-11-03 DIAGNOSIS — R300 Dysuria: Secondary | ICD-10-CM

## 2016-11-03 LAB — POCT URINALYSIS DIPSTICK
Bilirubin, UA: NEGATIVE
Glucose, UA: NEGATIVE
KETONES UA: NEGATIVE
LEUKOCYTES UA: NEGATIVE
NITRITE UA: NEGATIVE
PH UA: 6 (ref 5.0–8.0)
PROTEIN UA: NEGATIVE
Spec Grav, UA: 1.02 (ref 1.010–1.025)
UROBILINOGEN UA: 0.2 U/dL

## 2016-11-03 MED ORDER — CIPROFLOXACIN HCL 500 MG PO TABS: 500.0000 mg | ORAL_TABLET | Freq: Two times a day (BID) | ORAL | 0 refills | Status: DC

## 2016-11-03 NOTE — Progress Notes (Signed)
   Subjective:  Patient ID: Jeronimo Greavesobert Grigoryan, male    DOB: 10/03/1975  Age: 41 y.o. MRN: [id number]  CC: Urinary Tract Infection (frequent urinate,painful, going on for 5 days,)   Urinary Tract Infection   This is a new problem. The current episode started in the past 7 days. The problem occurs every urination. The problem has been unchanged. The quality of the pain is described as burning. There has been no fever. He is sexually active. There is no history of pyelonephritis. Associated symptoms include hematuria and urgency. Pertinent negatives include no chills, discharge, flank pain, frequency, hesitancy, nausea, possible pregnancy, sweats or vomiting. He has tried nothing for the symptoms. His past medical history is significant for kidney stones.    Outpatient Medications Prior to Visit  Medication Sig Dispense Refill  . amphetamine-dextroamphetamine (ADDERALL) 20 MG tablet Take 0.5 tablets (10 mg total) by mouth 2 (two) times daily. And 0.5 tablets 1 time daily as needed. 40 tablet 0   No facility-administered medications prior to visit.     ROS See HPI  Objective:  BP 112/80   Pulse 79   Temp 98 F (36.7 C)   Ht 5\' 9"  (1.753 m)   Wt 172 lb (78 kg)   SpO2 98%   BMI 25.40 kg/m   BP Readings from Last 3 Encounters:  11/03/16 112/80  08/22/16 112/74  06/10/16 122/82    Wt Readings from Last 3 Encounters:  11/03/16 172 lb (78 kg)  08/22/16 174 lb (78.9 kg)  06/10/16 178 lb (80.7 kg)    Physical Exam  Constitutional: He is oriented to person, place, and time. No distress.  Cardiovascular: Normal rate.   Pulmonary/Chest: Effort normal.  Abdominal: Soft. Bowel sounds are normal. He exhibits no distension. There is no tenderness.  No CVA tenderness  Neurological: He is alert and oriented to person, place, and time.  Skin: Skin is warm and dry.  Vitals reviewed.   Lab Results  Component Value Date   WBC 6.0 07/04/2014   HGB 14.1 07/04/2014   HCT 41.0 07/04/2014   PLT 171 07/04/2014   ALT 16 07/04/2014   AST 17 07/04/2014    No results found.  Assessment & Plan:   Molly MaduroRobert was seen today for urinary tract infection.  Diagnoses and all orders for this visit:  Dysuria -     POCT urinalysis dipstick -     Urine culture; Future -     ciprofloxacin (CIPRO) 500 MG tablet; Take 1 tablet (500 mg total) by mouth 2 (two) times daily.   I am having Mr. Coreas start on ciprofloxacin. I am also having him maintain his amphetamine-dextroamphetamine.  Meds ordered this encounter  Medications  . ciprofloxacin (CIPRO) 500 MG tablet    Sig: Take 1 tablet (500 mg total) by mouth 2 (two) times daily.    Dispense:  14 tablet    Refill:  0    Order Specific Question:   Supervising Provider    Answer:   Tresa GarterPLOTNIKOV, ALEKSEI V [[id number]]    Follow-up: Return if symptoms worsen or fail to improve.  Alysia Pennaharlotte Brandan Robicheaux, NP

## 2016-11-03 NOTE — Patient Instructions (Signed)

## 2016-11-04 ENCOUNTER — Ambulatory Visit: Payer: Self-pay | Admitting: Family

## 2016-11-04 LAB — URINE CULTURE: Organism ID, Bacteria: NO GROWTH

## 2016-12-15 ENCOUNTER — Ambulatory Visit (INDEPENDENT_AMBULATORY_CARE_PROVIDER_SITE_OTHER): Payer: BLUE CROSS/BLUE SHIELD | Admitting: Family

## 2016-12-15 ENCOUNTER — Encounter: Payer: Self-pay | Admitting: Family

## 2016-12-15 VITALS — BP 124/72 | HR 75 | Temp 98.4°F | Ht 69.0 in | Wt 175.0 lb

## 2016-12-15 DIAGNOSIS — F900 Attention-deficit hyperactivity disorder, predominantly inattentive type: Secondary | ICD-10-CM | POA: Diagnosis not present

## 2016-12-15 MED ORDER — AMPHETAMINE-DEXTROAMPHETAMINE 10 MG PO TABS: 5.0000 mg | ORAL_TABLET | Freq: Two times a day (BID) | ORAL | 0 refills | Status: DC

## 2016-12-15 MED ORDER — AMPHETAMINE-DEXTROAMPHETAMINE 10 MG PO TABS: 5.0000 mg | ORAL_TABLET | Freq: Two times a day (BID) | ORAL | 0 refills | Status: AC

## 2016-12-15 NOTE — Assessment & Plan Note (Signed)
Symptoms are adequately controlled with usage of less medicine. Decrease Adderall to 5 mg twice daily. [place] Controlled Substance Database reviewed with no irregularities. Follow up in 1 month or sooner if needed.

## 2016-12-15 NOTE — Progress Notes (Signed)
Subjective:    Patient ID: Shawn Horton, male    DOB: 01/15/1976, 41 y.o.   MRN: [id number]  Chief Complaint  Patient presents with  . Medication Refill    Adderall too strong    HPI:  Shawn GreavesRobert Norman is a 41 y.o. male who  has a past medical history of ADHD (07/25/2014) and Chicken pox. and presents today for a follow up office visit.  ADHD - Currently maintained on Adderall and reports taking the medication as prescribed and denies adverse side effects. Concentration and symptoms are generally well controlled with the current dosage of medication and has been reducing his dose recently as he feels he is not needing as much.  Sleeping approximately 6-8 hours per night. No significant changes to appetite or weight. No headaches, chest pain, shortness of breath, or heart palpitations.    No Known Allergies    Outpatient Medications Prior to Visit  Medication Sig Dispense Refill  . amphetamine-dextroamphetamine (ADDERALL) 20 MG tablet Take 0.5 tablets (10 mg total) by mouth 2 (two) times daily. And 0.5 tablets 1 time daily as needed. 40 tablet 0  . ciprofloxacin (CIPRO) 500 MG tablet Take 1 tablet (500 mg total) by mouth 2 (two) times daily. 14 tablet 0   No facility-administered medications prior to visit.     Review of Systems  Constitutional: Negative for appetite change, diaphoresis, fatigue, fever and unexpected weight change.  Respiratory: Negative for chest tightness and shortness of breath.   Cardiovascular: Negative for chest pain, palpitations and leg swelling.  Neurological: Negative for dizziness and light-headedness.  Psychiatric/Behavioral: Negative for decreased concentration and sleep disturbance. The patient is not nervous/anxious and is not hyperactive.       Objective:    BP 124/72 (BP Location: Left Arm, Patient Position: Sitting, Cuff Size: Large)   Pulse 75   Temp 98.4 F (36.9 C) (Oral)   Ht 5\' 9"  (1.753 m)   Wt 175 lb (79.4 kg)   SpO2 98%   BMI 25.84  kg/m  Nursing note and vital signs reviewed.  Physical Exam  Constitutional: He is oriented to person, place, and time. He appears well-developed and well-nourished. No distress.  Cardiovascular: Normal rate, regular rhythm, normal heart sounds and intact distal pulses.   Pulmonary/Chest: Effort normal and breath sounds normal.  Neurological: He is alert and oriented to person, place, and time.  Skin: Skin is warm and dry.  Psychiatric: He has a normal mood and affect. His behavior is normal. Judgment and thought content normal.       Assessment & Plan:   Problem List Items Addressed This Visit      Other   ADHD - Primary    Symptoms are adequately controlled with usage of less medicine. Decrease Adderall to 5 mg twice daily. [place] Controlled Substance Database reviewed with no irregularities. Follow up in 1 month or sooner if needed.           I have discontinued Mr. Tilman NeatWalker's ciprofloxacin. I am also having him maintain his amphetamine-dextroamphetamine.   Meds ordered this encounter  Medications  . DISCONTD: amphetamine-dextroamphetamine (ADDERALL) 10 MG tablet    Sig: Take 0.5 tablets (5 mg total) by mouth 2 (two) times daily.    Dispense:  30 tablet    Refill:  0    Order Specific Question:   Supervising Provider    Answer:   Hillard DankerRAWFORD, ELIZABETH A [[id number]]  . amphetamine-dextroamphetamine (ADDERALL) 10 MG tablet    Sig: Take 0.5 tablets (  5 mg total) by mouth 2 (two) times daily.    Dispense:  30 tablet    Refill:  0    Fill on or after 01/14/17    Order Specific Question:   Supervising Provider    Answer:   Hillard Danker A [[id number]]     Follow-up: Return in about 1 month (around 01/14/2017), or if symptoms worsen or fail to improve.  Jeanine Luz, FNP

## 2016-12-15 NOTE — Patient Instructions (Signed)
Thank you for choosing [facility].  SUMMARY AND INSTRUCTIONS:  Decrease your Adderall to 5 mg twice daily.   Medication:  Your prescription(s) have been submitted to your pharmacy or been printed and provided for you. Please take as directed and contact our office if you believe you are having problem(s) with the medication(s) or have any questions.  Follow up:  If your symptoms worsen or fail to improve, please contact our office for further instruction, or in case of emergency go directly to the emergency room at the closest medical facility.
